# Patient Record
Sex: Female | Born: 1945 | Race: White | Hispanic: No | Smoking: Never smoker
Health system: Southern US, Community
[De-identification: ages and names within clinical notes are randomized; demographics above are authoritative.]

## PROBLEM LIST (undated history)

## (undated) DIAGNOSIS — E785 Hyperlipidemia, unspecified: Secondary | ICD-10-CM

## (undated) DIAGNOSIS — E119 Type 2 diabetes mellitus without complications: Secondary | ICD-10-CM

## (undated) DIAGNOSIS — I10 Essential (primary) hypertension: Secondary | ICD-10-CM

## (undated) HISTORY — PX: BREAST EXCISIONAL BIOPSY: SUR124

## (undated) HISTORY — DX: Hyperlipidemia, unspecified: E78.5

## (undated) HISTORY — DX: Essential (primary) hypertension: I10

## (undated) HISTORY — DX: Type 2 diabetes mellitus without complications: E11.9

## (undated) HISTORY — PX: AORTIC VALVE REPLACEMENT: SHX41

---

## 2016-09-22 ENCOUNTER — Encounter (HOSPITAL_COMMUNITY)
Admission: RE | Admit: 2016-09-22 | Discharge: 2016-09-22 | Disposition: A | Discharge status: Home / Self Care | Payer: Medicare Other | Source: Ambulatory Visit | Attending: Cardiology | Admitting: Cardiology

## 2016-09-22 VITALS — BP 132/68 | HR 84 | Ht 61.0 in | Wt 219.0 lb

## 2016-09-22 DIAGNOSIS — Z954 Presence of other heart-valve replacement: Secondary | ICD-10-CM | POA: Diagnosis not present

## 2016-09-22 NOTE — Progress Notes (Signed)
Cardiac Individual Treatment Plan  Patient Details  Name: Jessica Cuevas MRN: [id number] Date of Birth: 02-04-1946 Referring Provider:     CARDIAC REHAB PHASE II ORIENTATION from 09/22/2016 in [facility] CARDIAC REHABILITATION  Referring Provider  Dr. Lady Gary      Initial Encounter Date:    CARDIAC REHAB PHASE II ORIENTATION from 09/22/2016 in [place] CARDIAC REHABILITATION  Date  09/22/16  Referring Provider  Dr. Lady Gary      Visit Diagnosis: Status post aortic valve replacement with composite valve  Patient's Home Medications on Admission:  Current Outpatient Prescriptions:  .  carvedilol (COREG) 6.25 MG tablet, Take by mouth., Disp: , Rfl:  .  lisinopril (PRINIVIL,ZESTRIL) 20 MG tablet, Take by mouth., Disp: , Rfl:  .  Multiple Vitamin (MULTIVITAMIN WITH MINERALS) TABS tablet, Take 1 tablet by mouth daily., Disp: , Rfl:  .  aspirin 81 MG chewable tablet, Chew by mouth., Disp: , Rfl:  .  Cholecalciferol (VITAMIN D3) 5000 units TABS, Take 5,000 Units by mouth daily., Disp: , Rfl:  .  Coenzyme Q-10 200 MG CAPS, Take by mouth., Disp: , Rfl:  .  glimepiride (AMARYL) 4 MG tablet, Take by mouth., Disp: , Rfl:  .  insulin detemir (LEVEMIR) 100 UNIT/ML injection, Inject 40 Units into the skin at bedtime., Disp: , Rfl:  .  Magnesium Oxide, Antacid, 500 MG CAPS, Take by mouth., Disp: , Rfl:  .  Multiple Vitamins-Minerals (PRESERVISION AREDS 2+MULTI VIT PO), Take 1 capsule by mouth 2 (two) times daily., Disp: , Rfl:   Past Medical History: No past medical history on file.  Tobacco Use: History  Smoking Status  . Not on file  Smokeless Tobacco  . Not on file    Labs: Recent Review Flowsheet Data    There is no flowsheet data to display.      Capillary Blood Glucose: No results found for: GLUCAP   Exercise Target Goals: Date: 09/22/16  Exercise Program Goal: Individual exercise prescription set with THRR, safety & activity barriers. Participant demonstrates ability to  understand and report RPE using BORG scale, to self-measure pulse accurately, and to acknowledge the importance of the exercise prescription.  Exercise Prescription Goal: Starting with aerobic activity 30 plus minutes a day, 3 days per week for initial exercise prescription. Provide home exercise prescription and guidelines that participant acknowledges understanding prior to discharge.  Activity Barriers & Risk Stratification:   6 Minute Walk:     6 Minute Walk    Row Name 09/22/16 1401         6 Minute Walk   Phase Initial     Distance 1200 feet     Distance % Change 0 %     Walk Time 6 minutes     # of Rest Breaks 0     MPH 2.27     METS 2.74     RPE 13     Perceived Dyspnea  13     VO2 Peak 7.47     Symptoms No     Resting HR 84 bpm     Resting BP 132/68     Max Ex. HR 111 bpm     Max Ex. BP 148/84     2 Minute Post BP 130/74        Oxygen Initial Assessment:   Oxygen Re-Evaluation:   Oxygen Discharge (Final Oxygen Re-Evaluation):   Initial Exercise Prescription:     Initial Exercise Prescription - 09/22/16 1400      Date  of Initial Exercise RX and Referring Provider   Date 09/22/16   Referring Provider Dr. Lady Gary     Treadmill   MPH 1.3   Grade 0   Minutes 15   METs 1.9     NuStep   Level 2   SPM 24   Minutes 20   METs 2     Prescription Details   Frequency (times per week) 3   Duration Progress to 30 minutes of continuous aerobic without signs/symptoms of physical distress     Intensity   THRR 40-80% of Max Heartrate [phone]   Ratings of Perceived Exertion 11-13   Perceived Dyspnea 0-4     Progression   Progression Continue progressive overload as per policy without signs/symptoms or physical distress.     Resistance Training   Training Prescription Yes   Weight 1   Reps 10-15      Perform Capillary Blood Glucose checks as needed.  Exercise Prescription Changes:   Exercise Comments:   Exercise Goals and Review:       Exercise Goals    Row Name 09/22/16 1429             Exercise Goals   Increase Physical Activity Yes       Intervention Provide advice, education, support and counseling about physical activity/exercise needs.;Develop an individualized exercise prescription for aerobic and resistive training based on initial evaluation findings, risk stratification, comorbidities and participant's personal goals.       Expected Outcomes Achievement of increased cardiorespiratory fitness and enhanced flexibility, muscular endurance and strength shown through measurements of functional capacity and personal statement of participant.       Increase Strength and Stamina Yes       Intervention Provide advice, education, support and counseling about physical activity/exercise needs.;Develop an individualized exercise prescription for aerobic and resistive training based on initial evaluation findings, risk stratification, comorbidities and participant's personal goals.       Expected Outcomes Achievement of increased cardiorespiratory fitness and enhanced flexibility, muscular endurance and strength shown through measurements of functional capacity and personal statement of participant.          Exercise Goals Re-Evaluation :    Discharge Exercise Prescription (Final Exercise Prescription Changes):   Nutrition:  Target Goals: Understanding of nutrition guidelines, daily intake of sodium 1500mg , cholesterol 200mg , calories 30% from fat and 7% or less from saturated fats, daily to have 5 or more servings of fruits and vegetables.  Biometrics:     Pre Biometrics - 09/22/16 1406      Pre Biometrics   Height 5\' 1"  (1.549 m)   Waist Circumference 44 inches   Hip Circumference 48 inches   Waist to Hip Ratio 0.92 %   Triceps Skinfold 24 mm   % Body Fat 49.7 %   Grip Strength 39.06 kg   Flexibility 0 in  Herniated Disc Lumbar Spine.    Single Leg Stand 2 seconds       Nutrition Therapy Plan and  Nutrition Goals:   Nutrition Discharge: Rate Your Plate Scores:     Nutrition Assessments - 09/22/16 1509      MEDFICTS Scores   Pre Score 18      Nutrition Goals Re-Evaluation:   Nutrition Goals Discharge (Final Nutrition Goals Re-Evaluation):   Psychosocial: Target Goals: Acknowledge presence or absence of significant depression and/or stress, maximize coping skills, provide positive support system. Participant is able to verbalize types and ability to use techniques and skills needed for reducing  stress and depression.  Initial Review & Psychosocial Screening:     Initial Psych Review & Screening - 09/22/16 1503      Initial Review   Current issues with None Identified     Family Dynamics   Good Support System? Yes   Concerns Recent loss of significant other  Patient lose her 8 month grandson in December 2017.   Comments Patient has had a recent loss of a grand child. She has recently moved from [place] and lost her job. She says it has been hard but she is not depressed. Her QOL score was 25.41 and her PHQ-9 2.      Barriers   Psychosocial barriers to participate in program Psychosocial barriers identified (see note)     Screening Interventions   Interventions Encouraged to exercise      Quality of Life Scores:     Quality of Life - 09/22/16 1407      Quality of Life Scores   Health/Function Pre 22.96 %   Socioeconomic Pre 25.08 %   Psych/Spiritual Pre 28 %   Family Pre 30 %   GLOBAL Pre 25.41 %      PHQ-9: Recent Review Flowsheet Data    Depression screen [facility] 2/9 09/22/2016   Decreased Interest 0   Down, Depressed, Hopeless 0   PHQ - 2 Score 0   Altered sleeping 0   Tired, decreased energy 1   Change in appetite 1   Feeling bad or failure about yourself  0   Trouble concentrating 0   Moving slowly or fidgety/restless 0   Suicidal thoughts 0   PHQ-9 Score 2   Difficult doing work/chores Not difficult at all     Interpretation of Total  Score  Total Score Depression Severity:  1-4 = Minimal depression, 5-9 = Mild depression, 10-14 = Moderate depression, 15-19 = Moderately severe depression, 20-27 = Severe depression   Psychosocial Evaluation and Intervention:     Psychosocial Evaluation - 09/22/16 1506      Psychosocial Evaluation & Interventions   Interventions Relaxation education;Encouraged to exercise with the program and follow exercise prescription;Stress management education   Comments Patient has had a recent loss of a grand child. She has recently moved from [place] and lost her job. She says it has been hard but she is not depressed. Her QOL score was 25.41 and her PHQ-9 2.   Expected Outcomes Patient will have no psychsocial barriers identified at discharge.    Continue Psychosocial Services  No Follow up required      Psychosocial Re-Evaluation:   Psychosocial Discharge (Final Psychosocial Re-Evaluation):   Vocational Rehabilitation: Provide vocational rehab assistance to qualifying candidates.   Vocational Rehab Evaluation & Intervention:     Vocational Rehab - 09/22/16 1427      Initial Vocational Rehab Evaluation & Intervention   Assessment shows need for Vocational Rehabilitation No      Education: Education Goals: Education classes will be provided on a weekly basis, covering required topics. Participant will state understanding/return demonstration of topics presented.  Learning Barriers/Preferences:     Learning Barriers/Preferences - 09/22/16 1427      Learning Barriers/Preferences   Learning Barriers None   Learning Preferences Skilled Demonstration;Video      Education Topics: Hypertension, Hypertension Reduction -Define heart disease and high blood pressure. Discus how high blood pressure affects the body and ways to reduce high blood pressure.   Exercise and Your Heart -Discuss why it is important to exercise,  the FITT principles of exercise, normal and abnormal  responses to exercise, and how to exercise safely.   Angina -Discuss definition of angina, causes of angina, treatment of angina, and how to decrease risk of having angina.   Cardiac Medications -Review what the following cardiac medications are used for, how they affect the body, and side effects that may occur when taking the medications.  Medications include Aspirin, Beta blockers, calcium channel blockers, ACE Inhibitors, angiotensin receptor blockers, diuretics, digoxin, and antihyperlipidemics.   Congestive Heart Failure -Discuss the definition of CHF, how to live with CHF, the signs and symptoms of CHF, and how keep track of weight and sodium intake.   Heart Disease and Intimacy -Discus the effect sexual activity has on the heart, how changes occur during intimacy as we age, and safety during sexual activity.   Smoking Cessation / COPD -Discuss different methods to quit smoking, the health benefits of quitting smoking, and the definition of COPD.   Nutrition I: Fats -Discuss the types of cholesterol, what cholesterol does to the heart, and how cholesterol levels can be controlled.   Nutrition II: Labels -Discuss the different components of food labels and how to read food label   Heart Parts and Heart Disease -Discuss the anatomy of the heart, the pathway of blood circulation through the heart, and these are affected by heart disease.   Stress I: Signs and Symptoms -Discuss the causes of stress, how stress may lead to anxiety and depression, and ways to limit stress.   Stress II: Relaxation -Discuss different types of relaxation techniques to limit stress.   Warning Signs of Stroke / TIA -Discuss definition of a stroke, what the signs and symptoms are of a stroke, and how to identify when someone is having stroke.   Knowledge Questionnaire Score:     Knowledge Questionnaire Score - 09/22/16 1426      Knowledge Questionnaire Score   Pre Score 21/24       Core Components/Risk Factors/Patient Goals at Admission:     Personal Goals and Risk Factors at Admission - 09/22/16 1458      Core Components/Risk Factors/Patient Goals on Admission    Weight Management Obesity;Yes   Intervention Weight Management: Develop a combined nutrition and exercise program designed to reach desired caloric intake, while maintaining appropriate intake of nutrient and fiber, sodium and fats, and appropriate energy expenditure required for the weight goal.;Weight Management: Provide education and appropriate resources to help participant work on and attain dietary goals.;Weight Management/Obesity: Establish reasonable short term and long term weight goals.;Obesity: Provide education and appropriate resources to help participant work on and attain dietary goals.   Admit Weight 219 lb (99.3 kg)   Goal Weight: Short Term 214 lb (97.1 kg)   Goal Weight: Long Term 209 lb (94.8 kg)   Expected Outcomes Short Term: Continue to assess and modify interventions until short term weight is achieved;Long Term: Adherence to nutrition and physical activity/exercise program aimed toward attainment of established weight goal   Improve shortness of breath with ADL's Yes   Intervention Provide education, individualized exercise plan and daily activity instruction to help decrease symptoms of SOB with activities of daily living.   Expected Outcomes Short Term: Achieves a reduction of symptoms when performing activities of daily living.   Diabetes Yes   Intervention Provide education about signs/symptoms and action to take for hypo/hyperglycemia.;Provide education about proper nutrition, including hydration, and aerobic/resistive exercise prescription along with prescribed medications to achieve blood glucose in normal ranges:  Fasting glucose 65-99 mg/dL   Expected Outcomes Long Term: Attainment of HbA1C < 7%.   Personal Goal Other Yes   Personal Goal Increase strenght and stamina. Do  things without SOB.    Intervention Patient will attend CR 3 days/week and supplement with exercise 2 days/week at home.    Expected Outcomes Patient will meet her personal goals.       Core Components/Risk Factors/Patient Goals Review:    Core Components/Risk Factors/Patient Goals at Discharge (Final Review):    ITP Comments:   Comments: Patient arrived for 1st visit/orientation/education at 1230. Patient was referred to CR by Harold HedgeKenneth Fath due to S/P Aortic Valve Replacement (Z95.4). During orientation advised patient on arrival and appointment times what to wear, what to do before, during and after exercise. Reviewed attendance and class policy. Talked about inclement weather and class consultation policy. Pt is scheduled to return Cardiac Rehab on 09/25/16 at 11:00. Pt was advised to come to class 15 minutes before class starts. Patient was also given instructions on meeting with the dietician and attending the Family Structure classes. Pt is eager to get started. Patient participated in warm up stretches followed by light weights and resistance bands. Patient was able to complete 6 minute walk test. Patient was measured for the equipment. Discussed equipment safety with patient. Took patient pre-anthropometric measurements. Patient finished visit at 1430.

## 2016-09-22 NOTE — Progress Notes (Signed)
Daily Session Note  Patient Details  Name: Jessica Cuevas MRN: [id number] Date of Birth: 11-02-1945 Referring Provider:     CARDIAC REHAB PHASE II ORIENTATION from 09/22/2016 in [place]  Referring Provider  Dr. Ubaldo Glassing      Encounter Date: 09/22/2016  Check In:     Session Check In - 09/22/16 1230      Check-In   Location [facility]   Staff Present Suzanne Boron, BS, EP, Exercise Physiologist;Zenith Lamphier Wynetta Emery, RN, BSN   Supervising physician immediately available to respond to emergencies See telemetry face sheet for immediately available MD   Medication changes reported     No   Fall or balance concerns reported    No   Tobacco Cessation --  Patient has never smoked or used smokeless tobacco.   Warm-up and Cool-down Performed as group-led instruction   Resistance Training Performed Yes   VAD Patient? No     Pain Assessment   Currently in Pain? No/denies   Multiple Pain Sites No      Capillary Blood Glucose: No results found for this or any previous visit (from the past 24 hour(s)).    History  Smoking Status  . Not on file  Smokeless Tobacco  . Not on file    Goals Met:  Proper associated with RPD/PD & O2 Sat Exercise tolerated well Personal goals reviewed No report of cardiac concerns or symptoms Strength training completed today  Goals Unmet:  Not Applicable  Comments: Check out 1430. Patient's orientation visit.    Dr. Kate Sable is Medical Director for [facility] Cardiac and Pulmonary Rehab.

## 2016-09-22 NOTE — Progress Notes (Signed)
Cardiac/Pulmonary Rehab Medication Review by a Pharmacist  Does the patient  feel that his/her medications are working for him/her?  yes  Has the patient been experiencing any side effects to the medications prescribed?  no  Does the patient measure his/her own blood pressure or blood glucose at home?  yes   Does the patient have any problems obtaining medications due to transportation or finances?   yes  Understanding of regimen: good Understanding of indications: good Potential of compliance: excellent  Questions asked to Determine Patient Understanding of Medication Regimen:  1. What is the name of the medication?  2. What is the medication used for?  3. When should it be taken?  4. How much should be taken?  5. How will you take it?  6. What side effects should you report?  Understanding Defined as: Excellent: All questions above are correct Good: Questions 1-4 are correct Fair: Questions 1-2 are correct  Poor: 1 or none of the above questions are correct   Pharmacist comments: Pt does c/o an itchy rash that comes and goes but cannot determine if it's caused by any specific medication.  Has been taking current medications for quite a while.  Pt does state that the Levemir is very expensive and has difficulty obtaining it at times.  Pt is close to being in "donut hole".  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  Pt states she does check her blood sugar and will start checking blood pressure.  Med list updated.    Jessica Cuevas, [name] A 09/22/2016 2:24 PM

## 2016-09-25 ENCOUNTER — Encounter (HOSPITAL_COMMUNITY)
Admission: RE | Admit: 2016-09-25 | Discharge: 2016-09-25 | Disposition: A | Discharge status: Home / Self Care | Payer: Medicare Other | Source: Ambulatory Visit | Attending: Cardiology | Admitting: Cardiology

## 2016-09-25 DIAGNOSIS — Z954 Presence of other heart-valve replacement: Secondary | ICD-10-CM

## 2016-09-25 NOTE — Progress Notes (Signed)
Cardiac Individual Treatment Plan  Patient Details  Name: Jessica Cuevas MRN: [id number] Date of Birth: [birth year] Referring Provider:     CARDIAC REHAB PHASE II ORIENTATION from 09/22/2016 in [facility] CARDIAC REHABILITATION  Referring Provider  Dr. Lady Gary      Initial Encounter Date:    CARDIAC REHAB PHASE II ORIENTATION from 09/22/2016 in [place] CARDIAC REHABILITATION  Date  09/22/16  Referring Provider  Dr. Lady Gary      Visit Diagnosis: Status post aortic valve replacement with composite valve  Patient's Home Medications on Admission:  Current Outpatient Prescriptions:  .  aspirin 81 MG chewable tablet, Chew by mouth., Disp: , Rfl:  .  carvedilol (COREG) 6.25 MG tablet, Take by mouth., Disp: , Rfl:  .  Cholecalciferol (VITAMIN D3) 5000 units TABS, Take 5,000 Units by mouth daily., Disp: , Rfl:  .  Coenzyme Q-10 200 MG CAPS, Take by mouth., Disp: , Rfl:  .  glimepiride (AMARYL) 4 MG tablet, Take by mouth., Disp: , Rfl:  .  insulin detemir (LEVEMIR) 100 UNIT/ML injection, Inject 40 Units into the skin at bedtime., Disp: , Rfl:  .  lisinopril (PRINIVIL,ZESTRIL) 20 MG tablet, Take by mouth., Disp: , Rfl:  .  Magnesium Oxide, Antacid, 500 MG CAPS, Take by mouth., Disp: , Rfl:  .  Multiple Vitamin (MULTIVITAMIN WITH MINERALS) TABS tablet, Take 1 tablet by mouth daily., Disp: , Rfl:  .  Multiple Vitamins-Minerals (PRESERVISION AREDS 2+MULTI VIT PO), Take 1 capsule by mouth 2 (two) times daily., Disp: , Rfl:   Past Medical History: No past medical history on file.  Tobacco Use: History  Smoking Status  . Not on file  Smokeless Tobacco  . Not on file    Labs: Recent Review Flowsheet Data    There is no flowsheet data to display.      Capillary Blood Glucose: No results found for: GLUCAP   Exercise Target Goals:    Exercise Program Goal: Individual exercise prescription set with THRR, safety & activity barriers. Participant demonstrates ability to understand and  report RPE using BORG scale, to self-measure pulse accurately, and to acknowledge the importance of the exercise prescription.  Exercise Prescription Goal: Starting with aerobic activity 30 plus minutes a day, 3 days per week for initial exercise prescription. Provide home exercise prescription and guidelines that participant acknowledges understanding prior to discharge.  Activity Barriers & Risk Stratification:   6 Minute Walk:     6 Minute Walk    Row Name 09/22/16 1401         6 Minute Walk   Phase Initial     Distance 1200 feet     Distance % Change 0 %     Walk Time 6 minutes     # of Rest Breaks 0     MPH 2.27     METS 2.74     RPE 13     Perceived Dyspnea  13     VO2 Peak 7.47     Symptoms No     Resting HR 84 bpm     Resting BP 132/68     Max Ex. HR 111 bpm     Max Ex. BP 148/84     2 Minute Post BP 130/74        Oxygen Initial Assessment:   Oxygen Re-Evaluation:   Oxygen Discharge (Final Oxygen Re-Evaluation):   Initial Exercise Prescription:     Initial Exercise Prescription - 09/22/16 1400      Date  of Initial Exercise RX and Referring Provider   Date 09/22/16   Referring Provider Dr. Lady GaryFath     Treadmill   MPH 1.3   Grade 0   Minutes 15   METs 1.9     NuStep   Level 2   SPM 24   Minutes 20   METs 2     Prescription Details   Frequency (times per week) 3   Duration Progress to 30 minutes of continuous aerobic without signs/symptoms of physical distress     Intensity   THRR 40-80% of Max Heartrate [phone]   Ratings of Perceived Exertion 11-13   Perceived Dyspnea 0-4     Progression   Progression Continue progressive overload as per policy without signs/symptoms or physical distress.     Resistance Training   Training Prescription Yes   Weight 1   Reps 10-15      Perform Capillary Blood Glucose checks as needed.  Exercise Prescription Changes:   Exercise Comments:   Exercise Goals and Review:      Exercise  Goals    Row Name 09/22/16 1429             Exercise Goals   Increase Physical Activity Yes       Intervention Provide advice, education, support and counseling about physical activity/exercise needs.;Develop an individualized exercise prescription for aerobic and resistive training based on initial evaluation findings, risk stratification, comorbidities and participant's personal goals.       Expected Outcomes Achievement of increased cardiorespiratory fitness and enhanced flexibility, muscular endurance and strength shown through measurements of functional capacity and personal statement of participant.       Increase Strength and Stamina Yes       Intervention Provide advice, education, support and counseling about physical activity/exercise needs.;Develop an individualized exercise prescription for aerobic and resistive training based on initial evaluation findings, risk stratification, comorbidities and participant's personal goals.       Expected Outcomes Achievement of increased cardiorespiratory fitness and enhanced flexibility, muscular endurance and strength shown through measurements of functional capacity and personal statement of participant.          Exercise Goals Re-Evaluation :    Discharge Exercise Prescription (Final Exercise Prescription Changes):   Nutrition:  Target Goals: Understanding of nutrition guidelines, daily intake of sodium 1500mg , cholesterol 200mg , calories 30% from fat and 7% or less from saturated fats, daily to have 5 or more servings of fruits and vegetables.  Biometrics:     Pre Biometrics - 09/22/16 1406      Pre Biometrics   Height 5\' 1"  (1.549 m)   Waist Circumference 44 inches   Hip Circumference 48 inches   Waist to Hip Ratio 0.92 %   Triceps Skinfold 24 mm   % Body Fat 49.7 %   Grip Strength 39.06 kg   Flexibility 0 in  Herniated Disc Lumbar Spine.    Single Leg Stand 2 seconds       Nutrition Therapy Plan and Nutrition  Goals:   Nutrition Discharge: Rate Your Plate Scores:     Nutrition Assessments - 09/22/16 1509      MEDFICTS Scores   Pre Score 18      Nutrition Goals Re-Evaluation:   Nutrition Goals Discharge (Final Nutrition Goals Re-Evaluation):   Psychosocial: Target Goals: Acknowledge presence or absence of significant depression and/or stress, maximize coping skills, provide positive support system. Participant is able to verbalize types and ability to use techniques and skills needed for reducing  stress and depression.  Initial Review & Psychosocial Screening:     Initial Psych Review & Screening - 09/22/16 1503      Initial Review   Current issues with None Identified     Family Dynamics   Good Support System? Yes   Concerns Recent loss of significant other  Patient lose her 8 month grandson in December 2017.   Comments Patient has had a recent loss of a grand child. She has recently moved from [place] and lost her job. She says it has been hard but she is not depressed. Her QOL score was 25.41 and her PHQ-9 2.      Barriers   Psychosocial barriers to participate in program Psychosocial barriers identified (see note)     Screening Interventions   Interventions Encouraged to exercise      Quality of Life Scores:     Quality of Life - 09/22/16 1407      Quality of Life Scores   Health/Function Pre 22.96 %   Socioeconomic Pre 25.08 %   Psych/Spiritual Pre 28 %   Family Pre 30 %   GLOBAL Pre 25.41 %      PHQ-9: Recent Review Flowsheet Data    Depression screen [facility] 2/9 09/22/2016   Decreased Interest 0   Down, Depressed, Hopeless 0   PHQ - 2 Score 0   Altered sleeping 0   Tired, decreased energy 1   Change in appetite 1   Feeling bad or failure about yourself  0   Trouble concentrating 0   Moving slowly or fidgety/restless 0   Suicidal thoughts 0   PHQ-9 Score 2   Difficult doing work/chores Not difficult at all     Interpretation of Total Score   Total Score Depression Severity:  1-4 = Minimal depression, 5-9 = Mild depression, 10-14 = Moderate depression, 15-19 = Moderately severe depression, 20-27 = Severe depression   Psychosocial Evaluation and Intervention:     Psychosocial Evaluation - 09/22/16 1506      Psychosocial Evaluation & Interventions   Interventions Relaxation education;Encouraged to exercise with the program and follow exercise prescription;Stress management education   Comments Patient has had a recent loss of a grand child. She has recently moved from [place] and lost her job. She says it has been hard but she is not depressed. Her QOL score was 25.41 and her PHQ-9 2.   Expected Outcomes Patient will have no psychsocial barriers identified at discharge.    Continue Psychosocial Services  No Follow up required      Psychosocial Re-Evaluation:   Psychosocial Discharge (Final Psychosocial Re-Evaluation):   Vocational Rehabilitation: Provide vocational rehab assistance to qualifying candidates.   Vocational Rehab Evaluation & Intervention:     Vocational Rehab - 09/22/16 1427      Initial Vocational Rehab Evaluation & Intervention   Assessment shows need for Vocational Rehabilitation No      Education: Education Goals: Education classes will be provided on a weekly basis, covering required topics. Participant will state understanding/return demonstration of topics presented.  Learning Barriers/Preferences:     Learning Barriers/Preferences - 09/22/16 1427      Learning Barriers/Preferences   Learning Barriers None   Learning Preferences Skilled Demonstration;Video      Education Topics: Hypertension, Hypertension Reduction -Define heart disease and high blood pressure. Discus how high blood pressure affects the body and ways to reduce high blood pressure.   Exercise and Your Heart -Discuss why it is important to exercise,  the FITT principles of exercise, normal and abnormal responses  to exercise, and how to exercise safely.   Angina -Discuss definition of angina, causes of angina, treatment of angina, and how to decrease risk of having angina.   Cardiac Medications -Review what the following cardiac medications are used for, how they affect the body, and side effects that may occur when taking the medications.  Medications include Aspirin, Beta blockers, calcium channel blockers, ACE Inhibitors, angiotensin receptor blockers, diuretics, digoxin, and antihyperlipidemics.   Congestive Heart Failure -Discuss the definition of CHF, how to live with CHF, the signs and symptoms of CHF, and how keep track of weight and sodium intake.   Heart Disease and Intimacy -Discus the effect sexual activity has on the heart, how changes occur during intimacy as we age, and safety during sexual activity.   Smoking Cessation / COPD -Discuss different methods to quit smoking, the health benefits of quitting smoking, and the definition of COPD.   Nutrition I: Fats -Discuss the types of cholesterol, what cholesterol does to the heart, and how cholesterol levels can be controlled.   Nutrition II: Labels -Discuss the different components of food labels and how to read food label   Heart Parts and Heart Disease -Discuss the anatomy of the heart, the pathway of blood circulation through the heart, and these are affected by heart disease.   Stress I: Signs and Symptoms -Discuss the causes of stress, how stress may lead to anxiety and depression, and ways to limit stress.   Stress II: Relaxation -Discuss different types of relaxation techniques to limit stress.   Warning Signs of Stroke / TIA -Discuss definition of a stroke, what the signs and symptoms are of a stroke, and how to identify when someone is having stroke.   Knowledge Questionnaire Score:     Knowledge Questionnaire Score - 09/22/16 1426      Knowledge Questionnaire Score   Pre Score 21/24      Core  Components/Risk Factors/Patient Goals at Admission:     Personal Goals and Risk Factors at Admission - 09/22/16 1458      Core Components/Risk Factors/Patient Goals on Admission    Weight Management Obesity;Yes   Intervention Weight Management: Develop a combined nutrition and exercise program designed to reach desired caloric intake, while maintaining appropriate intake of nutrient and fiber, sodium and fats, and appropriate energy expenditure required for the weight goal.;Weight Management: Provide education and appropriate resources to help participant work on and attain dietary goals.;Weight Management/Obesity: Establish reasonable short term and long term weight goals.;Obesity: Provide education and appropriate resources to help participant work on and attain dietary goals.   Admit Weight 219 lb (99.3 kg)   Goal Weight: Short Term 214 lb (97.1 kg)   Goal Weight: Long Term 209 lb (94.8 kg)   Expected Outcomes Short Term: Continue to assess and modify interventions until short term weight is achieved;Long Term: Adherence to nutrition and physical activity/exercise program aimed toward attainment of established weight goal   Improve shortness of breath with ADL's Yes   Intervention Provide education, individualized exercise plan and daily activity instruction to help decrease symptoms of SOB with activities of daily living.   Expected Outcomes Short Term: Achieves a reduction of symptoms when performing activities of daily living.   Diabetes Yes   Intervention Provide education about signs/symptoms and action to take for hypo/hyperglycemia.;Provide education about proper nutrition, including hydration, and aerobic/resistive exercise prescription along with prescribed medications to achieve blood glucose in normal ranges:  Fasting glucose 65-99 mg/dL   Expected Outcomes Long Term: Attainment of HbA1C < 7%.   Personal Goal Other Yes   Personal Goal Increase strenght and stamina. Do things without  SOB.    Intervention Patient will attend CR 3 days/week and supplement with exercise 2 days/week at home.    Expected Outcomes Patient will meet her personal goals.       Core Components/Risk Factors/Patient Goals Review:    Core Components/Risk Factors/Patient Goals at Discharge (Final Review):    ITP Comments:     ITP Comments    Row Name 09/25/16 1246           ITP Comments Patient new to program. Started today 09/25/16. Will continue to monitor for progress.           Comments: ITP 30 Day REVIEW Patient new to program. Started today 09/25/16. Will continue to monitor for progress.

## 2016-09-25 NOTE — Progress Notes (Signed)
Daily Session Note  Patient Details  Name: Jessica Cuevas MRN: [id number] Date of Birth: [birth year] Referring Provider:     CARDIAC REHAB PHASE II ORIENTATION from 09/22/2016 in [facility]  Referring Provider  Dr. Ubaldo Glassing      Encounter Date: 09/25/2016  Check In:     Session Check In - 09/25/16 1119      Check-In   Location [facility]   Staff Present Aundra Dubin, RN, BSN;Eda Magnussen Luther Parody, BS, EP, Exercise Physiologist   Supervising physician immediately available to respond to emergencies See telemetry face sheet for immediately available MD   Medication changes reported     No   Fall or balance concerns reported    No   Warm-up and Cool-down Performed as group-led instruction   Resistance Training Performed Yes   VAD Patient? No     Pain Assessment   Currently in Pain? No/denies   Pain Score 0-No pain   Multiple Pain Sites No      Capillary Blood Glucose: No results found for this or any previous visit (from the past 24 hour(s)).    History  Smoking Status  . Not on file  Smokeless Tobacco  . Not on file    Goals Met:  Independence with exercise equipment Exercise tolerated well No report of cardiac concerns or symptoms Strength training completed today  Goals Unmet:  Not Applicable  Comments: Check out 1200   Dr. Bronson Ing

## 2016-09-27 ENCOUNTER — Encounter (HOSPITAL_COMMUNITY)
Admission: RE | Admit: 2016-09-27 | Discharge: 2016-09-27 | Disposition: A | Discharge status: Home / Self Care | Payer: Medicare Other | Source: Ambulatory Visit | Attending: Cardiology | Admitting: Cardiology

## 2016-09-27 DIAGNOSIS — Z954 Presence of other heart-valve replacement: Secondary | ICD-10-CM | POA: Diagnosis not present

## 2016-09-27 NOTE — Progress Notes (Signed)
Daily Session Note  Patient Details  Name: Jessica Cuevas MRN: [id number] Date of Birth: 03/27/1946 Referring Provider:     CARDIAC REHAB PHASE II ORIENTATION from 09/22/2016 in [facility] CARDIAC REHABILITATION  Referring Provider  Dr. Fath      Encounter Date: 09/27/2016  Check In:     Session Check In - 09/27/16 1105      Check-In   Location [facility]   Staff Present Debra Johnson, RN, BSN;Diane Coad, MS, EP, CHC, Exercise Physiologist;Tifanny Dollens, BS, EP, Exercise Physiologist   Supervising physician immediately available to respond to emergencies See telemetry face sheet for immediately available MD   Medication changes reported     No   Fall or balance concerns reported    No   Warm-up and Cool-down Performed as group-led instruction   Resistance Training Performed Yes   VAD Patient? No     Pain Assessment   Currently in Pain? Yes   Pain Score 3    Pain Location Back   Pain Orientation Left   Pain Descriptors / Indicators Constant   Pain Type Acute pain   Pain Onset Yesterday   Pain Frequency Occasional   Multiple Pain Sites No      Capillary Blood Glucose: No results found for this or any previous visit (from the past 24 hour(s)).    History  Smoking Status  . Not on file  Smokeless Tobacco  . Not on file    Goals Met:  Independence with exercise equipment Exercise tolerated well No report of cardiac concerns or symptoms Strength training completed today  Goals Unmet:  Not Applicable  Comments: Check out 1200. Pain 3/10 lower back and left glute.    Dr. Suresh Koneswaran is Medical Director for [facility]. 

## 2016-09-29 ENCOUNTER — Encounter (HOSPITAL_COMMUNITY)
Admission: RE | Admit: 2016-09-29 | Discharge: 2016-09-29 | Disposition: A | Discharge status: Home / Self Care | Payer: Medicare Other | Source: Ambulatory Visit | Attending: Cardiology | Admitting: Cardiology

## 2016-09-29 DIAGNOSIS — Z954 Presence of other heart-valve replacement: Secondary | ICD-10-CM | POA: Diagnosis not present

## 2016-09-29 NOTE — Progress Notes (Signed)
Daily Session Note  Patient Details  Name: Taylour Lietzke MRN: [id number] Date of Birth: [birth year] Referring Provider:     CARDIAC REHAB PHASE II ORIENTATION from 09/22/2016 in [facility]  Referring Provider  Dr. Ubaldo Glassing      Encounter Date: 09/29/2016  Check In:     Session Check In - 09/29/16 1105      Check-In   Location [facility]   Staff Present Aundra Dubin, RN, BSN;Kaelin Bonelli Luther Parody, BS, EP, Exercise Physiologist   Supervising physician immediately available to respond to emergencies See telemetry face sheet for immediately available MD   Medication changes reported     No   Fall or balance concerns reported    No   Warm-up and Cool-down Performed as group-led instruction   Resistance Training Performed Yes   VAD Patient? No     Pain Assessment   Currently in Pain? No/denies   Pain Score 0-No pain   Multiple Pain Sites No      Capillary Blood Glucose: No results found for this or any previous visit (from the past 24 hour(s)).    History  Smoking Status  . Not on file  Smokeless Tobacco  . Not on file    Goals Met:  Independence with exercise equipment Exercise tolerated well No report of cardiac concerns or symptoms Strength training completed today  Goals Unmet:  Not Applicable  Comments: Check out 1200   Dr. Kate Sable is Medical Director for [facility].

## 2016-10-02 ENCOUNTER — Encounter (HOSPITAL_COMMUNITY): Payer: Medicare Other

## 2016-10-04 ENCOUNTER — Encounter (HOSPITAL_COMMUNITY): Payer: Medicare Other

## 2016-10-06 ENCOUNTER — Encounter (HOSPITAL_COMMUNITY): Payer: Medicare Other

## 2016-10-09 ENCOUNTER — Encounter (HOSPITAL_COMMUNITY): Payer: Medicare Other

## 2016-10-11 ENCOUNTER — Encounter (HOSPITAL_COMMUNITY): Payer: Medicare Other

## 2016-10-13 ENCOUNTER — Encounter (HOSPITAL_COMMUNITY): Payer: Medicare Other

## 2016-10-16 ENCOUNTER — Encounter (HOSPITAL_COMMUNITY): Payer: Medicare Other

## 2016-10-18 ENCOUNTER — Encounter (HOSPITAL_COMMUNITY): Payer: Medicare Other

## 2016-10-19 ENCOUNTER — Encounter (HOSPITAL_COMMUNITY): Payer: Medicare Other

## 2016-10-19 NOTE — Progress Notes (Signed)
Cardiac Individual Treatment Plan  Patient Details  Name: Jessica Cuevas MRN: [id number] Date of Birth: 20-Apr-1945 Referring Provider:     CARDIAC REHAB PHASE II ORIENTATION from 09/22/2016 in [facility] CARDIAC REHABILITATION  Referring Provider  Dr. Lady Gary      Initial Encounter Date:    CARDIAC REHAB PHASE II ORIENTATION from 09/22/2016 in [place] CARDIAC REHABILITATION  Date  09/22/16  Referring Provider  Dr. Lady Gary      Visit Diagnosis: Status post aortic valve replacement with composite valve  Patient's Home Medications on Admission:  Current Outpatient Prescriptions:  .  aspirin 81 MG chewable tablet, Chew by mouth., Disp: , Rfl:  .  carvedilol (COREG) 6.25 MG tablet, Take by mouth., Disp: , Rfl:  .  Cholecalciferol (VITAMIN D3) 5000 units TABS, Take 5,000 Units by mouth daily., Disp: , Rfl:  .  Coenzyme Q-10 200 MG CAPS, Take by mouth., Disp: , Rfl:  .  glimepiride (AMARYL) 4 MG tablet, Take by mouth., Disp: , Rfl:  .  insulin detemir (LEVEMIR) 100 UNIT/ML injection, Inject 40 Units into the skin at bedtime., Disp: , Rfl:  .  lisinopril (PRINIVIL,ZESTRIL) 20 MG tablet, Take by mouth., Disp: , Rfl:  .  Magnesium Oxide, Antacid, 500 MG CAPS, Take by mouth., Disp: , Rfl:  .  Multiple Vitamin (MULTIVITAMIN WITH MINERALS) TABS tablet, Take 1 tablet by mouth daily., Disp: , Rfl:  .  Multiple Vitamins-Minerals (PRESERVISION AREDS 2+MULTI VIT PO), Take 1 capsule by mouth 2 (two) times daily., Disp: , Rfl:   Past Medical History: No past medical history on file.  Tobacco Use: History  Smoking Status  . Not on file  Smokeless Tobacco  . Not on file    Labs: Recent Review Flowsheet Data    There is no flowsheet data to display.      Capillary Blood Glucose: No results found for: GLUCAP   Exercise Target Goals:    Exercise Program Goal: Individual exercise prescription set with THRR, safety & activity barriers. Participant demonstrates ability to understand and  report RPE using BORG scale, to self-measure pulse accurately, and to acknowledge the importance of the exercise prescription.  Exercise Prescription Goal: Starting with aerobic activity 30 plus minutes a day, 3 days per week for initial exercise prescription. Provide home exercise prescription and guidelines that participant acknowledges understanding prior to discharge.  Activity Barriers & Risk Stratification:   6 Minute Walk:     6 Minute Walk    Row Name 09/22/16 1401         6 Minute Walk   Phase Initial     Distance 1200 feet     Distance % Change 0 %     Walk Time 6 minutes     # of Rest Breaks 0     MPH 2.27     METS 2.74     RPE 13     Perceived Dyspnea  13     VO2 Peak 7.47     Symptoms No     Resting HR 84 bpm     Resting BP 132/68     Max Ex. HR 111 bpm     Max Ex. BP 148/84     2 Minute Post BP 130/74        Oxygen Initial Assessment:   Oxygen Re-Evaluation:   Oxygen Discharge (Final Oxygen Re-Evaluation):   Initial Exercise Prescription:     Initial Exercise Prescription - 09/22/16 1400      Date  of Initial Exercise RX and Referring Provider   Date 09/22/16   Referring Provider Dr. Lady GaryFath     Treadmill   MPH 1.3   Grade 0   Minutes 15   METs 1.9     NuStep   Level 2   SPM 24   Minutes 20   METs 2     Prescription Details   Frequency (times per week) 3   Duration Progress to 30 minutes of continuous aerobic without signs/symptoms of physical distress     Intensity   THRR 40-80% of Max Heartrate [phone]   Ratings of Perceived Exertion 11-13   Perceived Dyspnea 0-4     Progression   Progression Continue progressive overload as per policy without signs/symptoms or physical distress.     Resistance Training   Training Prescription Yes   Weight 1   Reps 10-15      Perform Capillary Blood Glucose checks as needed.  Exercise Prescription Changes:   Exercise Comments:   Exercise Goals and Review:      Exercise  Goals    Row Name 09/22/16 1429             Exercise Goals   Increase Physical Activity Yes       Intervention Provide advice, education, support and counseling about physical activity/exercise needs.;Develop an individualized exercise prescription for aerobic and resistive training based on initial evaluation findings, risk stratification, comorbidities and participant's personal goals.       Expected Outcomes Achievement of increased cardiorespiratory fitness and enhanced flexibility, muscular endurance and strength shown through measurements of functional capacity and personal statement of participant.       Increase Strength and Stamina Yes       Intervention Provide advice, education, support and counseling about physical activity/exercise needs.;Develop an individualized exercise prescription for aerobic and resistive training based on initial evaluation findings, risk stratification, comorbidities and participant's personal goals.       Expected Outcomes Achievement of increased cardiorespiratory fitness and enhanced flexibility, muscular endurance and strength shown through measurements of functional capacity and personal statement of participant.          Exercise Goals Re-Evaluation :    Discharge Exercise Prescription (Final Exercise Prescription Changes):   Nutrition:  Target Goals: Understanding of nutrition guidelines, daily intake of sodium 1500mg , cholesterol 200mg , calories 30% from fat and 7% or less from saturated fats, daily to have 5 or more servings of fruits and vegetables.  Biometrics:     Pre Biometrics - 09/22/16 1406      Pre Biometrics   Height 5\' 1"  (1.549 m)   Waist Circumference 44 inches   Hip Circumference 48 inches   Waist to Hip Ratio 0.92 %   Triceps Skinfold 24 mm   % Body Fat 49.7 %   Grip Strength 39.06 kg   Flexibility 0 in  Herniated Disc Lumbar Spine.    Single Leg Stand 2 seconds       Nutrition Therapy Plan and Nutrition  Goals:   Nutrition Discharge: Rate Your Plate Scores:     Nutrition Assessments - 09/22/16 1509      MEDFICTS Scores   Pre Score 18      Nutrition Goals Re-Evaluation:   Nutrition Goals Discharge (Final Nutrition Goals Re-Evaluation):   Psychosocial: Target Goals: Acknowledge presence or absence of significant depression and/or stress, maximize coping skills, provide positive support system. Participant is able to verbalize types and ability to use techniques and skills needed for reducing  stress and depression.  Initial Review & Psychosocial Screening:     Initial Psych Review & Screening - 09/22/16 1503      Initial Review   Current issues with None Identified     Family Dynamics   Good Support System? Yes   Concerns Recent loss of significant other  Patient lose her 8 month grandson in December 2017.   Comments Patient has had a recent loss of a grand child. She has recently moved from [place] and lost her job. She says it has been hard but she is not depressed. Her QOL score was 25.41 and her PHQ-9 2.      Barriers   Psychosocial barriers to participate in program Psychosocial barriers identified (see note)     Screening Interventions   Interventions Encouraged to exercise      Quality of Life Scores:     Quality of Life - 09/22/16 1407      Quality of Life Scores   Health/Function Pre 22.96 %   Socioeconomic Pre 25.08 %   Psych/Spiritual Pre 28 %   Family Pre 30 %   GLOBAL Pre 25.41 %      PHQ-9: Recent Review Flowsheet Data    Depression screen [facility] 2/9 09/22/2016   Decreased Interest 0   Down, Depressed, Hopeless 0   PHQ - 2 Score 0   Altered sleeping 0   Tired, decreased energy 1   Change in appetite 1   Feeling bad or failure about yourself  0   Trouble concentrating 0   Moving slowly or fidgety/restless 0   Suicidal thoughts 0   PHQ-9 Score 2   Difficult doing work/chores Not difficult at all     Interpretation of Total Score   Total Score Depression Severity:  1-4 = Minimal depression, 5-9 = Mild depression, 10-14 = Moderate depression, 15-19 = Moderately severe depression, 20-27 = Severe depression   Psychosocial Evaluation and Intervention:     Psychosocial Evaluation - 09/22/16 1506      Psychosocial Evaluation & Interventions   Interventions Relaxation education;Encouraged to exercise with the program and follow exercise prescription;Stress management education   Comments Patient has had a recent loss of a grand child. She has recently moved from [place] and lost her job. She says it has been hard but she is not depressed. Her QOL score was 25.41 and her PHQ-9 2.   Expected Outcomes Patient will have no psychsocial barriers identified at discharge.    Continue Psychosocial Services  No Follow up required      Psychosocial Re-Evaluation:   Psychosocial Discharge (Final Psychosocial Re-Evaluation):   Vocational Rehabilitation: Provide vocational rehab assistance to qualifying candidates.   Vocational Rehab Evaluation & Intervention:     Vocational Rehab - 09/22/16 1427      Initial Vocational Rehab Evaluation & Intervention   Assessment shows need for Vocational Rehabilitation No      Education: Education Goals: Education classes will be provided on a weekly basis, covering required topics. Participant will state understanding/return demonstration of topics presented.  Learning Barriers/Preferences:     Learning Barriers/Preferences - 09/22/16 1427      Learning Barriers/Preferences   Learning Barriers None   Learning Preferences Skilled Demonstration;Video      Education Topics: Hypertension, Hypertension Reduction -Define heart disease and high blood pressure. Discus how high blood pressure affects the body and ways to reduce high blood pressure.   Exercise and Your Heart -Discuss why it is important to exercise,  the FITT principles of exercise, normal and abnormal responses  to exercise, and how to exercise safely.   Angina -Discuss definition of angina, causes of angina, treatment of angina, and how to decrease risk of having angina.   Cardiac Medications -Review what the following cardiac medications are used for, how they affect the body, and side effects that may occur when taking the medications.  Medications include Aspirin, Beta blockers, calcium channel blockers, ACE Inhibitors, angiotensin receptor blockers, diuretics, digoxin, and antihyperlipidemics.   Congestive Heart Failure -Discuss the definition of CHF, how to live with CHF, the signs and symptoms of CHF, and how keep track of weight and sodium intake.   Heart Disease and Intimacy -Discus the effect sexual activity has on the heart, how changes occur during intimacy as we age, and safety during sexual activity.   Smoking Cessation / COPD -Discuss different methods to quit smoking, the health benefits of quitting smoking, and the definition of COPD.   Nutrition I: Fats -Discuss the types of cholesterol, what cholesterol does to the heart, and how cholesterol levels can be controlled.   CARDIAC REHAB PHASE II EXERCISE from 09/27/2016 in [facility] CARDIAC REHABILITATION  Date  09/27/16  Educator  DC  Instruction Review Code  2- meets goals/outcomes      Nutrition II: Labels -Discuss the different components of food labels and how to read food label   Heart Parts and Heart Disease -Discuss the anatomy of the heart, the pathway of blood circulation through the heart, and these are affected by heart disease.   Stress I: Signs and Symptoms -Discuss the causes of stress, how stress may lead to anxiety and depression, and ways to limit stress.   Stress II: Relaxation -Discuss different types of relaxation techniques to limit stress.   Warning Signs of Stroke / TIA -Discuss definition of a stroke, what the signs and symptoms are of a stroke, and how to identify when someone is  having stroke.   Knowledge Questionnaire Score:     Knowledge Questionnaire Score - 09/22/16 1426      Knowledge Questionnaire Score   Pre Score 21/24      Core Components/Risk Factors/Patient Goals at Admission:     Personal Goals and Risk Factors at Admission - 09/22/16 1458      Core Components/Risk Factors/Patient Goals on Admission    Weight Management Obesity;Yes   Intervention Weight Management: Develop a combined nutrition and exercise program designed to reach desired caloric intake, while maintaining appropriate intake of nutrient and fiber, sodium and fats, and appropriate energy expenditure required for the weight goal.;Weight Management: Provide education and appropriate resources to help participant work on and attain dietary goals.;Weight Management/Obesity: Establish reasonable short term and long term weight goals.;Obesity: Provide education and appropriate resources to help participant work on and attain dietary goals.   Admit Weight 219 lb (99.3 kg)   Goal Weight: Short Term 214 lb (97.1 kg)   Goal Weight: Long Term 209 lb (94.8 kg)   Expected Outcomes Short Term: Continue to assess and modify interventions until short term weight is achieved;Long Term: Adherence to nutrition and physical activity/exercise program aimed toward attainment of established weight goal   Improve shortness of breath with ADL's Yes   Intervention Provide education, individualized exercise plan and daily activity instruction to help decrease symptoms of SOB with activities of daily living.   Expected Outcomes Short Term: Achieves a reduction of symptoms when performing activities of daily living.   Diabetes Yes  Intervention Provide education about signs/symptoms and action to take for hypo/hyperglycemia.;Provide education about proper nutrition, including hydration, and aerobic/resistive exercise prescription along with prescribed medications to achieve blood glucose in normal ranges:  Fasting glucose 65-99 mg/dL   Expected Outcomes Long Term: Attainment of HbA1C < 7%.   Personal Goal Other Yes   Personal Goal Increase strenght and stamina. Do things without SOB.    Intervention Patient will attend CR 3 days/week and supplement with exercise 2 days/week at home.    Expected Outcomes Patient will meet her personal goals.       Core Components/Risk Factors/Patient Goals Review:    Core Components/Risk Factors/Patient Goals at Discharge (Final Review):    ITP Comments:     ITP Comments    Row Name 09/25/16 1246 10/19/16 0737         ITP Comments Patient new to program. Started today 09/25/16. Will continue to monitor for progress.  Patient has completed 4 sessions. She has stopped the program to do physical therapy to help treat low back pain secondary to a pinched nerve. She plans to return to complete the program if the therapy relieves the pain. Will continue to monitor.          Comments: ITP 30 Day REVIEW Patient has completed 4 sessions. She has stopped the program to do physical therapy to help treat low back pain secondary to a compressed nerve. She plans to return to complete the program if the therapy relieves the pain. Will continue to monitor.

## 2016-10-20 ENCOUNTER — Encounter (HOSPITAL_COMMUNITY): Payer: Medicare Other

## 2016-10-23 ENCOUNTER — Encounter (HOSPITAL_COMMUNITY): Payer: Medicare Other

## 2016-10-25 ENCOUNTER — Encounter (HOSPITAL_COMMUNITY): Payer: Medicare Other

## 2016-10-27 ENCOUNTER — Encounter (HOSPITAL_COMMUNITY): Payer: Medicare Other

## 2016-10-30 ENCOUNTER — Encounter (HOSPITAL_COMMUNITY): Payer: Medicare Other

## 2016-11-01 ENCOUNTER — Encounter (HOSPITAL_COMMUNITY): Payer: Medicare Other

## 2016-11-03 ENCOUNTER — Other Ambulatory Visit: Payer: Self-pay | Admitting: Family Medicine

## 2016-11-03 ENCOUNTER — Encounter (HOSPITAL_COMMUNITY): Payer: Medicare Other

## 2016-11-03 DIAGNOSIS — Z1231 Encounter for screening mammogram for malignant neoplasm of breast: Secondary | ICD-10-CM

## 2016-11-06 ENCOUNTER — Encounter (HOSPITAL_COMMUNITY): Payer: Medicare Other

## 2016-11-07 NOTE — Progress Notes (Signed)
Cardiac Individual Treatment Plan  Patient Details  Name: Jessica Cuevas MRN: [id number] Date of Birth: Sep 14, 1945 Referring Provider:     CARDIAC REHAB PHASE II ORIENTATION from 09/22/2016 in [facility] CARDIAC REHABILITATION  Referring Provider  Dr. Lady Gary      Initial Encounter Date:    CARDIAC REHAB PHASE II ORIENTATION from 09/22/2016 in [place] CARDIAC REHABILITATION  Date  09/22/16  Referring Provider  Dr. Lady Gary      Visit Diagnosis: Status post aortic valve replacement with composite valve  Patient's Home Medications on Admission:  Current Outpatient Prescriptions:  .  aspirin 81 MG chewable tablet, Chew by mouth., Disp: , Rfl:  .  carvedilol (COREG) 6.25 MG tablet, Take by mouth., Disp: , Rfl:  .  Cholecalciferol (VITAMIN D3) 5000 units TABS, Take 5,000 Units by mouth daily., Disp: , Rfl:  .  Coenzyme Q-10 200 MG CAPS, Take by mouth., Disp: , Rfl:  .  glimepiride (AMARYL) 4 MG tablet, Take by mouth., Disp: , Rfl:  .  insulin detemir (LEVEMIR) 100 UNIT/ML injection, Inject 40 Units into the skin at bedtime., Disp: , Rfl:  .  lisinopril (PRINIVIL,ZESTRIL) 20 MG tablet, Take by mouth., Disp: , Rfl:  .  Magnesium Oxide, Antacid, 500 MG CAPS, Take by mouth., Disp: , Rfl:  .  Multiple Vitamin (MULTIVITAMIN WITH MINERALS) TABS tablet, Take 1 tablet by mouth daily., Disp: , Rfl:  .  Multiple Vitamins-Minerals (PRESERVISION AREDS 2+MULTI VIT PO), Take 1 capsule by mouth 2 (two) times daily., Disp: , Rfl:   Past Medical History: No past medical history on file.  Tobacco Use: History  Smoking Status  . Not on file  Smokeless Tobacco  . Not on file    Labs: Recent Review Flowsheet Data    There is no flowsheet data to display.      Capillary Blood Glucose: No results found for: GLUCAP   Exercise Target Goals:    Exercise Program Goal: Individual exercise prescription set with THRR, safety & activity barriers. Participant demonstrates ability to understand and  report RPE using BORG scale, to self-measure pulse accurately, and to acknowledge the importance of the exercise prescription.  Exercise Prescription Goal: Starting with aerobic activity 30 plus minutes a day, 3 days per week for initial exercise prescription. Provide home exercise prescription and guidelines that participant acknowledges understanding prior to discharge.  Activity Barriers & Risk Stratification:   6 Minute Walk:     6 Minute Walk    Row Name 09/22/16 1401         6 Minute Walk   Phase Initial     Distance 1200 feet     Distance % Change 0 %     Walk Time 6 minutes     # of Rest Breaks 0     MPH 2.27     METS 2.74     RPE 13     Perceived Dyspnea  13     VO2 Peak 7.47     Symptoms No     Resting HR 84 bpm     Resting BP 132/68     Max Ex. HR 111 bpm     Max Ex. BP 148/84     2 Minute Post BP 130/74        Oxygen Initial Assessment:   Oxygen Re-Evaluation:   Oxygen Discharge (Final Oxygen Re-Evaluation):   Initial Exercise Prescription:     Initial Exercise Prescription - 09/22/16 1400      Date  of Initial Exercise RX and Referring Provider   Date 09/22/16   Referring Provider Dr. Lady GaryFath     Treadmill   MPH 1.3   Grade 0   Minutes 15   METs 1.9     NuStep   Level 2   SPM 24   Minutes 20   METs 2     Prescription Details   Frequency (times per week) 3   Duration Progress to 30 minutes of continuous aerobic without signs/symptoms of physical distress     Intensity   THRR 40-80% of Max Heartrate [phone]   Ratings of Perceived Exertion 11-13   Perceived Dyspnea 0-4     Progression   Progression Continue progressive overload as per policy without signs/symptoms or physical distress.     Resistance Training   Training Prescription Yes   Weight 1   Reps 10-15      Perform Capillary Blood Glucose checks as needed.  Exercise Prescription Changes:   Exercise Comments:   Exercise Goals and Review:      Exercise  Goals    Row Name 09/22/16 1429             Exercise Goals   Increase Physical Activity Yes       Intervention Provide advice, education, support and counseling about physical activity/exercise needs.;Develop an individualized exercise prescription for aerobic and resistive training based on initial evaluation findings, risk stratification, comorbidities and participant's personal goals.       Expected Outcomes Achievement of increased cardiorespiratory fitness and enhanced flexibility, muscular endurance and strength shown through measurements of functional capacity and personal statement of participant.       Increase Strength and Stamina Yes       Intervention Provide advice, education, support and counseling about physical activity/exercise needs.;Develop an individualized exercise prescription for aerobic and resistive training based on initial evaluation findings, risk stratification, comorbidities and participant's personal goals.       Expected Outcomes Achievement of increased cardiorespiratory fitness and enhanced flexibility, muscular endurance and strength shown through measurements of functional capacity and personal statement of participant.          Exercise Goals Re-Evaluation :    Discharge Exercise Prescription (Final Exercise Prescription Changes):   Nutrition:  Target Goals: Understanding of nutrition guidelines, daily intake of sodium 1500mg , cholesterol 200mg , calories 30% from fat and 7% or less from saturated fats, daily to have 5 or more servings of fruits and vegetables.  Biometrics:     Pre Biometrics - 09/22/16 1406      Pre Biometrics   Height 5\' 1"  (1.549 m)   Waist Circumference 44 inches   Hip Circumference 48 inches   Waist to Hip Ratio 0.92 %   Triceps Skinfold 24 mm   % Body Fat 49.7 %   Grip Strength 39.06 kg   Flexibility 0 in  Herniated Disc Lumbar Spine.    Single Leg Stand 2 seconds       Nutrition Therapy Plan and Nutrition  Goals:   Nutrition Discharge: Rate Your Plate Scores:     Nutrition Assessments - 09/22/16 1509      MEDFICTS Scores   Pre Score 18      Nutrition Goals Re-Evaluation:   Nutrition Goals Discharge (Final Nutrition Goals Re-Evaluation):   Psychosocial: Target Goals: Acknowledge presence or absence of significant depression and/or stress, maximize coping skills, provide positive support system. Participant is able to verbalize types and ability to use techniques and skills needed for reducing  stress and depression.  Initial Review & Psychosocial Screening:     Initial Psych Review & Screening - 09/22/16 1503      Initial Review   Current issues with None Identified     Family Dynamics   Good Support System? Yes   Concerns Recent loss of significant other  Patient lose her 8 month grandson in December 2017.   Comments Patient has had a recent loss of a grand child. She has recently moved from [place] and lost her job. She says it has been hard but she is not depressed. Her QOL score was 25.41 and her PHQ-9 2.      Barriers   Psychosocial barriers to participate in program Psychosocial barriers identified (see note)     Screening Interventions   Interventions Encouraged to exercise      Quality of Life Scores:     Quality of Life - 09/22/16 1407      Quality of Life Scores   Health/Function Pre 22.96 %   Socioeconomic Pre 25.08 %   Psych/Spiritual Pre 28 %   Family Pre 30 %   GLOBAL Pre 25.41 %      PHQ-9: Recent Review Flowsheet Data    Depression screen [facility] 2/9 09/22/2016   Decreased Interest 0   Down, Depressed, Hopeless 0   PHQ - 2 Score 0   Altered sleeping 0   Tired, decreased energy 1   Change in appetite 1   Feeling bad or failure about yourself  0   Trouble concentrating 0   Moving slowly or fidgety/restless 0   Suicidal thoughts 0   PHQ-9 Score 2   Difficult doing work/chores Not difficult at all     Interpretation of Total Score   Total Score Depression Severity:  1-4 = Minimal depression, 5-9 = Mild depression, 10-14 = Moderate depression, 15-19 = Moderately severe depression, 20-27 = Severe depression   Psychosocial Evaluation and Intervention:     Psychosocial Evaluation - 09/22/16 1506      Psychosocial Evaluation & Interventions   Interventions Relaxation education;Encouraged to exercise with the program and follow exercise prescription;Stress management education   Comments Patient has had a recent loss of a grand child. She has recently moved from [place] and lost her job. She says it has been hard but she is not depressed. Her QOL score was 25.41 and her PHQ-9 2.   Expected Outcomes Patient will have no psychsocial barriers identified at discharge.    Continue Psychosocial Services  No Follow up required      Psychosocial Re-Evaluation:   Psychosocial Discharge (Final Psychosocial Re-Evaluation):   Vocational Rehabilitation: Provide vocational rehab assistance to qualifying candidates.   Vocational Rehab Evaluation & Intervention:     Vocational Rehab - 09/22/16 1427      Initial Vocational Rehab Evaluation & Intervention   Assessment shows need for Vocational Rehabilitation No      Education: Education Goals: Education classes will be provided on a weekly basis, covering required topics. Participant will state understanding/return demonstration of topics presented.  Learning Barriers/Preferences:     Learning Barriers/Preferences - 09/22/16 1427      Learning Barriers/Preferences   Learning Barriers None   Learning Preferences Skilled Demonstration;Video      Education Topics: Hypertension, Hypertension Reduction -Define heart disease and high blood pressure. Discus how high blood pressure affects the body and ways to reduce high blood pressure.   Exercise and Your Heart -Discuss why it is important to exercise,  the FITT principles of exercise, normal and abnormal responses  to exercise, and how to exercise safely.   Angina -Discuss definition of angina, causes of angina, treatment of angina, and how to decrease risk of having angina.   Cardiac Medications -Review what the following cardiac medications are used for, how they affect the body, and side effects that may occur when taking the medications.  Medications include Aspirin, Beta blockers, calcium channel blockers, ACE Inhibitors, angiotensin receptor blockers, diuretics, digoxin, and antihyperlipidemics.   Congestive Heart Failure -Discuss the definition of CHF, how to live with CHF, the signs and symptoms of CHF, and how keep track of weight and sodium intake.   Heart Disease and Intimacy -Discus the effect sexual activity has on the heart, how changes occur during intimacy as we age, and safety during sexual activity.   Smoking Cessation / COPD -Discuss different methods to quit smoking, the health benefits of quitting smoking, and the definition of COPD.   Nutrition I: Fats -Discuss the types of cholesterol, what cholesterol does to the heart, and how cholesterol levels can be controlled.   CARDIAC REHAB PHASE II EXERCISE from 09/27/2016 in [facility] CARDIAC REHABILITATION  Date  09/27/16  Educator  DC  Instruction Review Code  2- meets goals/outcomes      Nutrition II: Labels -Discuss the different components of food labels and how to read food label   Heart Parts and Heart Disease -Discuss the anatomy of the heart, the pathway of blood circulation through the heart, and these are affected by heart disease.   Stress I: Signs and Symptoms -Discuss the causes of stress, how stress may lead to anxiety and depression, and ways to limit stress.   Stress II: Relaxation -Discuss different types of relaxation techniques to limit stress.   Warning Signs of Stroke / TIA -Discuss definition of a stroke, what the signs and symptoms are of a stroke, and how to identify when someone is  having stroke.   Knowledge Questionnaire Score:     Knowledge Questionnaire Score - 09/22/16 1426      Knowledge Questionnaire Score   Pre Score 21/24      Core Components/Risk Factors/Patient Goals at Admission:     Personal Goals and Risk Factors at Admission - 09/22/16 1458      Core Components/Risk Factors/Patient Goals on Admission    Weight Management Obesity;Yes   Intervention Weight Management: Develop a combined nutrition and exercise program designed to reach desired caloric intake, while maintaining appropriate intake of nutrient and fiber, sodium and fats, and appropriate energy expenditure required for the weight goal.;Weight Management: Provide education and appropriate resources to help participant work on and attain dietary goals.;Weight Management/Obesity: Establish reasonable short term and long term weight goals.;Obesity: Provide education and appropriate resources to help participant work on and attain dietary goals.   Admit Weight 219 lb (99.3 kg)   Goal Weight: Short Term 214 lb (97.1 kg)   Goal Weight: Long Term 209 lb (94.8 kg)   Expected Outcomes Short Term: Continue to assess and modify interventions until short term weight is achieved;Long Term: Adherence to nutrition and physical activity/exercise program aimed toward attainment of established weight goal   Improve shortness of breath with ADL's Yes   Intervention Provide education, individualized exercise plan and daily activity instruction to help decrease symptoms of SOB with activities of daily living.   Expected Outcomes Short Term: Achieves a reduction of symptoms when performing activities of daily living.   Diabetes Yes  Intervention Provide education about signs/symptoms and action to take for hypo/hyperglycemia.;Provide education about proper nutrition, including hydration, and aerobic/resistive exercise prescription along with prescribed medications to achieve blood glucose in normal ranges:  Fasting glucose 65-99 mg/dL   Expected Outcomes Long Term: Attainment of HbA1C < 7%.   Personal Goal Other Yes   Personal Goal Increase strenght and stamina. Do things without SOB.    Intervention Patient will attend CR 3 days/week and supplement with exercise 2 days/week at home.    Expected Outcomes Patient will meet her personal goals.       Core Components/Risk Factors/Patient Goals Review:    Core Components/Risk Factors/Patient Goals at Discharge (Final Review):    ITP Comments:     ITP Comments    Row Name 09/25/16 1246 10/19/16 0737 11/07/16 1509       ITP Comments Patient new to program. Started today 09/25/16. Will continue to monitor for progress.  Patient has completed 4 sessions. She has stopped the program to do physical therapy to help treat low back pain secondary to a pinched nerve. She plans to return to complete the program if the therapy relieves the pain. Will continue to monitor.  Patient stopped coming after 4 sessions d/t chronic back pain. MD notified.         Comments: Patient stopped coming to Cardiac Rehab on 09/29/2016 after completing 4 sessions d/t chronic back pain. Doctor will be informed.

## 2016-11-07 NOTE — Addendum Note (Signed)
Encounter addended by: Suann LarryJohnson, Natalio Salois L, RN on: 11/07/2016  3:13 PM<BR>    Actions taken: Visit Navigator Flowsheet section accepted, Sign clinical note, Episode resolved

## 2016-11-07 NOTE — Progress Notes (Signed)
Discharge Summary  Patient Details  Name: Jessica Cuevas MRN: [id number] Date of Birth: 1945-05-04 Referring Provider:     CARDIAC REHAB PHASE II ORIENTATION [facility] 09[facility]22/[facility]  Referring Provider  Dr. Lady GaryFath       Number of Visits: 4  Reason for Discharge:  Early Exit:  Chronic back pain.  Smoking History:  History  Smoking Status  . Not on file  Smokeless Tobacco  . Not on file    Diagnosis:  Status post aortic valve replacement with composite valve  ADL UCSD:   Initial Exercise Prescription:     Initial Exercise Prescription - 09/22/16 1400      Date of Initial Exercise RX and Referring Provider   Date 09/22/16   Referring Provider Dr. Lady GaryFath     Treadmill   MPH 1.3   Grade 0   Minutes 15   METs 1.9     NuStep   Level 2   SPM 24   Minutes 20   METs 2     Prescription Details   Frequency (times per week) 3   Duration Progress to 30 minutes of continuous aerobic without signs/symptoms of physical distress     Intensity   THRR 40-80% of Max Heartrate [phone]   Ratings of Perceived Exertion 11-13   Perceived Dyspnea 0-4     Progression   Progression Continue progressive overload as per policy without signs/symptoms or physical distress.     Resistance Training   Training Prescription Yes   Weight 1   Reps 10-15      Discharge Exercise Prescription (Final Exercise Prescription Changes):   Functional Capacity:     6 Minute Walk    Row Name 09/22/16 1401         6 Minute Walk   Phase Initial     Distance 1200 feet     Distance % Change 0 %     Walk Time 6 minutes     # of Rest Breaks 0     MPH 2.27     METS 2.74     RPE 13     Perceived Dyspnea  13     VO2 Peak 7.47     Symptoms No     Resting HR 84 bpm     Resting BP 132/68     Max Ex. HR 111 bpm     Max Ex. BP 148/84     2 Minute Post BP 130/74        Psychological, QOL, Others - Outcomes: PHQ 2/9: Depression screen PHQ 2/9 09/22/2016   Decreased Interest 0  Down, Depressed, Hopeless 0  PHQ - 2 Score 0  Altered sleeping 0  Tired, decreased energy 1  Change in appetite 1  Feeling bad or failure about yourself  0  Trouble concentrating 0  Moving slowly or fidgety/restless 0  Suicidal thoughts 0  PHQ-9 Score 2  Difficult doing work/chores Not difficult at all    Quality of Life:     Quality of Life - 09/22/16 1407      Quality of Life Scores   Health/Function Pre 22.96 %   Socioeconomic Pre 25.08 %   Psych/Spiritual Pre 28 %   Family Pre 30 %   GLOBAL Pre 25.41 %      Personal Goals: Goals established at orientation with interventions provided to work toward goal.     Personal Goals and Risk Factors at Admission - 09/22/16 1458  Core Components/Risk Factors/Patient Goals on Admission    Weight Management Obesity;Yes   Intervention Weight Management: Develop a combined nutrition and exercise program designed to reach desired caloric intake, while maintaining appropriate intake of nutrient and fiber, sodium and fats, and appropriate energy expenditure required for the weight goal.;Weight Management: Provide education and appropriate resources to help participant work on and attain dietary goals.;Weight Management/Obesity: Establish reasonable short term and long term weight goals.;Obesity: Provide education and appropriate resources to help participant work on and attain dietary goals.   Admit Weight 219 lb (99.3 kg)   Goal Weight: Short Term 214 lb (97.1 kg)   Goal Weight: Long Term 209 lb (94.8 kg)   Expected Outcomes Short Term: Continue to assess and modify interventions until short term weight is achieved;Long Term: Adherence to nutrition and physical activity/exercise program aimed toward attainment of established weight goal   Improve shortness of breath with ADL's Yes   Intervention Provide education, individualized exercise plan and daily activity instruction to help decrease symptoms of SOB with  activities of daily living.   Expected Outcomes Short Term: Achieves a reduction of symptoms when performing activities of daily living.   Diabetes Yes   Intervention Provide education about signs/symptoms and action to take for hypo/hyperglycemia.;Provide education about proper nutrition, including hydration, and aerobic/resistive exercise prescription along with prescribed medications to achieve blood glucose in normal ranges: Fasting glucose 65-99 mg/dL   Expected Outcomes Long Term: Attainment of HbA1C < 7%.   Personal Goal Other Yes   Personal Goal Increase strenght and stamina. Do things without SOB.    Intervention Patient will attend CR 3 days/week and supplement with exercise 2 days/week at home.    Expected Outcomes Patient will meet her personal goals.        Personal Goals Discharge:   Nutrition & Weight - Outcomes:     Pre Biometrics - 09/22/16 1406      Pre Biometrics   Height 5\' 1"  (1.549 m)   Waist Circumference 44 inches   Hip Circumference 48 inches   Waist to Hip Ratio 0.92 %   Triceps Skinfold 24 mm   % Body Fat 49.7 %   Grip Strength 39.06 kg   Flexibility 0 in  Herniated Disc Lumbar Spine.    Single Leg Stand 2 seconds       Nutrition:   Nutrition Discharge:     Nutrition Assessments - 09/22/16 1509      MEDFICTS Scores   Pre Score 18      Education Questionnaire Score:     Knowledge Questionnaire Score - 09/22/16 1426      Knowledge Questionnaire Score   Pre Score 21/24

## 2016-11-08 ENCOUNTER — Encounter (HOSPITAL_COMMUNITY): Payer: Medicare Other

## 2016-11-10 ENCOUNTER — Encounter (HOSPITAL_COMMUNITY): Payer: Medicare Other

## 2016-11-13 ENCOUNTER — Encounter (HOSPITAL_COMMUNITY): Payer: Medicare Other

## 2016-11-15 ENCOUNTER — Encounter (HOSPITAL_COMMUNITY): Payer: Medicare Other

## 2016-11-17 ENCOUNTER — Encounter (HOSPITAL_COMMUNITY): Payer: Medicare Other

## 2016-11-20 ENCOUNTER — Encounter (HOSPITAL_COMMUNITY): Payer: Medicare Other

## 2016-11-22 ENCOUNTER — Ambulatory Visit
Admission: RE | Admit: 2016-11-22 | Discharge: 2016-11-22 | Disposition: A | Discharge status: Home / Self Care | Payer: Medicare Other | Source: Ambulatory Visit | Attending: Family Medicine | Admitting: Family Medicine

## 2016-11-22 ENCOUNTER — Encounter (HOSPITAL_COMMUNITY): Payer: Medicare Other

## 2016-11-22 DIAGNOSIS — Z1231 Encounter for screening mammogram for malignant neoplasm of breast: Secondary | ICD-10-CM | POA: Insufficient documentation

## 2016-11-24 ENCOUNTER — Encounter (HOSPITAL_COMMUNITY): Payer: Medicare Other

## 2016-11-27 ENCOUNTER — Encounter (HOSPITAL_COMMUNITY): Payer: Medicare Other

## 2016-11-28 ENCOUNTER — Inpatient Hospital Stay
Admission: RE | Admit: 2016-11-28 | Discharge: 2016-11-28 | Disposition: A | Discharge status: Home / Self Care | Payer: Self-pay | Source: Ambulatory Visit | Attending: *Deleted | Admitting: *Deleted

## 2016-11-28 ENCOUNTER — Other Ambulatory Visit: Payer: Self-pay | Admitting: *Deleted

## 2016-11-28 DIAGNOSIS — Z9289 Personal history of other medical treatment: Secondary | ICD-10-CM

## 2016-11-29 ENCOUNTER — Encounter (HOSPITAL_COMMUNITY): Payer: Medicare Other

## 2016-12-01 ENCOUNTER — Encounter (HOSPITAL_COMMUNITY): Payer: Medicare Other

## 2016-12-04 ENCOUNTER — Encounter (HOSPITAL_COMMUNITY): Payer: Medicare Other

## 2016-12-06 ENCOUNTER — Encounter (HOSPITAL_COMMUNITY): Payer: Medicare Other

## 2016-12-08 ENCOUNTER — Encounter (HOSPITAL_COMMUNITY): Payer: Medicare Other

## 2016-12-11 ENCOUNTER — Encounter (HOSPITAL_COMMUNITY): Payer: Medicare Other

## 2016-12-13 ENCOUNTER — Encounter (HOSPITAL_COMMUNITY): Payer: Medicare Other

## 2016-12-15 ENCOUNTER — Encounter (HOSPITAL_COMMUNITY): Payer: Medicare Other

## 2017-01-09 ENCOUNTER — Ambulatory Visit
Admission: RE | Admit: 2017-01-09 | Discharge: 2017-01-09 | Disposition: A | Discharge status: Home / Self Care | Payer: Medicare Other | Source: Ambulatory Visit | Attending: Family Medicine | Admitting: Family Medicine

## 2017-01-09 ENCOUNTER — Other Ambulatory Visit: Payer: Self-pay | Admitting: Family Medicine

## 2017-01-09 DIAGNOSIS — M79662 Pain in left lower leg: Secondary | ICD-10-CM

## 2017-01-19 ENCOUNTER — Encounter (INDEPENDENT_AMBULATORY_CARE_PROVIDER_SITE_OTHER): Payer: Self-pay | Admitting: Vascular Surgery

## 2017-01-19 ENCOUNTER — Ambulatory Visit (INDEPENDENT_AMBULATORY_CARE_PROVIDER_SITE_OTHER): Payer: Medicare Other | Admitting: Vascular Surgery

## 2017-01-19 VITALS — BP 157/88 | HR 91 | Resp 16 | Ht 61.0 in | Wt 220.0 lb

## 2017-01-19 DIAGNOSIS — I83813 Varicose veins of bilateral lower extremities with pain: Secondary | ICD-10-CM | POA: Insufficient documentation

## 2017-01-19 DIAGNOSIS — E119 Type 2 diabetes mellitus without complications: Secondary | ICD-10-CM | POA: Insufficient documentation

## 2017-01-19 DIAGNOSIS — M79605 Pain in left leg: Secondary | ICD-10-CM | POA: Diagnosis not present

## 2017-01-19 DIAGNOSIS — E118 Type 2 diabetes mellitus with unspecified complications: Secondary | ICD-10-CM | POA: Diagnosis not present

## 2017-01-19 DIAGNOSIS — M79604 Pain in right leg: Secondary | ICD-10-CM | POA: Diagnosis not present

## 2017-01-19 NOTE — Progress Notes (Signed)
Subjective:    Patient ID: Jessica Cuevas, female    DOB: December 03, 1945, 71 y.o.   MRN: [id number] Chief Complaint  Patient presents with  . New Patient (Initial Visit)    Varicose veins   Presents as a new patient referred by Dr. Lockie Pares for "painful varicosities". 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 She states that over the last 6 months they have become more painful. The patient reports the pain worsens towards the end of the day.Her pain also increases with sitting or standing for long periods of time. The varicosities to the left lower extremity are more painful then compared to the right. The patient does not experience any bilateral lower extremity edema. The patient does endorse a worsening history of pain with ambulation. This pain starts in her buttocks and radiates down the legs to the calf. Patient denies any rest pain or ulcerations to the lower extremity. Her discomfort has progressive the point she is unable to function on a daily basis and this is what prompted her to seek medical attention.Patient denies any trauma or surgeries to the bilateral lower extremity. Patient denies any history of DVT to the lower extremity. Patient denies any fever, nausea or vomiting.    Review of Systems  Constitutional: Negative.   HENT: Negative.   Eyes: Negative.   Respiratory: Negative.   Cardiovascular:       Bilateral painful varicose veins. Claudication with ambulation.  Gastrointestinal: Negative.   Endocrine: Negative.   Genitourinary: Negative.   Musculoskeletal: Negative.   Skin: Negative.   Allergic/Immunologic: Negative.   Neurological: Negative.   Hematological: Negative.   Psychiatric/Behavioral: Negative.       Objective:   Physical Exam  Constitutional: She is oriented to person, place, and time. She appears well-developed and well-nourished. No distress.  HENT:  Head: Normocephalic and atraumatic.  Eyes: Pupils  are equal, round, and reactive to light. Conjunctivae are normal.  Neck: Normal range of motion.  Cardiovascular: Normal rate, regular rhythm, normal heart sounds and intact distal pulses.   Pulses:      Radial pulses are 2+ on the right side, and 2+ on the left side.       Dorsalis pedis pulses are 1+ on the right side, and 1+ on the left side.       Posterior tibial pulses are 1+ on the right side, and 1+ on the left side.  Pulmonary/Chest: Effort normal.  Musculoskeletal: Normal range of motion. She exhibits no edema.  Neurological: She is alert and oriented to person, place, and time.  Skin: She is not diaphoretic.  Right lower extremity: greater than 1cm varicosities noted throughout the extremity. Left lower extremity: greater than 1cm and less than 1cm varicosities located throughout the extremity No stasis dermatitis. No cellulitis. Skin is intact.  Vitals reviewed.  BP (!) 157/88 (BP Location: Right Arm)   Pulse 91   Resp 16   Ht  (1.549 m)   Wt 220 lb (99.8 kg)   BMI 41.57 kg/m   Past Medical History:  Diagnosis Date  . Diabetes mellitus without complication (HCC)   . Hyperlipidemia   . Hypertension    Social History   Social History  . Marital status: Divorced    Spouse name: N/A  . Number of children: N/A  . Years of education: N/A   Occupational History  . Not on file.   Social History Main Topics  . Smoking status: Never Smoker  .  Smokeless tobacco: Never Used  . Alcohol use No  . Drug use: Unknown  . Sexual activity: Not on file   Other Topics Concern  . Not on file   Social History Narrative  . No narrative on file   Past Surgical History:  Procedure Laterality Date  . BREAST EXCISIONAL BIOPSY Left    benign excision   History reviewed. No pertinent family history.  Allergies  Allergen Reactions  . Aloe Rash  . Metformin And Related     Had lactic acidosis  . Penicillins Rash  . Tape Rash      Assessment & Plan:  Presents as  a new patient referred by Dr. Lockie Pares for "painful varicosities". 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 She states that over the last 6 months they have become more painful. The patient reports the pain worsens towards the end of the day.Her pain also increases with sitting or standing for long periods of time. The varicosities to the left lower extremity are more painful then compared to the right. The patient does not experience any bilateral lower extremity edema. The patient does endorse a worsening history of pain with ambulation. This pain starts in her buttocks and radiates down the legs to the calf. Patient denies any rest pain or ulcerations to the lower extremity. Her discomfort has progressive the point she is unable to function on a daily basis and this is what prompted her to seek medical attention.Patient denies any trauma or surgeries to the bilateral lower extremity. Patient denies any history of DVT to the lower extremity. Patient denies any fever, nausea or vomiting.   1. 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 His could be a major contributor to the patient's lower extremity discomfort. The patient does have risk factors for peripheral artery disease Hard to palpate pedal pulses We'll order an ABI to rule out any peripheral artery disease that may be contributing to the patient's discomfort  - VAS [place] ABI WITH/WO TBI; Future  2. Varicose veins of both lower extremities with pain - New The patient was encouraged to wear graduated compression stockings (20-30 mmHg) on a daily basis. The patient was instructed to begin wearing the stockings first thing in the morning and removing them in the evening. The patient was instructed specifically not to sleep in the stockings. Prescription given. In addition, behavioral modification including elevation during the day will  be initiated. The patient was advised to follow up in three months after wearing her compression stockings daily with elevation. The patient was instructed to call the office in the interim if any worsening edema or ulcerations to the legs, feet or toes occurs. The patient expresses their understanding.  - VAS [place] LOWER EXTREMITY VENOUS REFLUX; Future  3. Type 2 diabetes mellitus with complication, unspecified whether long term insulin use (HCC) - Stable Encouraged good control as its slows the progression of atherosclerotic disease  Current Outpatient Prescriptions on File Prior to Visit  Medication Sig Dispense Refill  . aspirin 81 MG chewable tablet Chew by mouth.    . carvedilol (COREG) 6.25 MG tablet Take by mouth.    . Cholecalciferol (VITAMIN D3) 5000 units TABS Take 5,000 Units by mouth daily.    . Coenzyme Q-10 200 MG CAPS Take by mouth.    Marland Kitchen glimepiride (AMARYL) 4 MG tablet Take by mouth.    Marland Kitchen lisinopril (PRINIVIL,ZESTRIL) 20 MG  tablet Take by mouth.    . Magnesium Oxide, Antacid, 500 MG CAPS Take by mouth.    . Multiple Vitamin (MULTIVITAMIN WITH MINERALS) TABS tablet Take 1 tablet by mouth daily.    . Multiple Vitamins-Minerals (PRESERVISION AREDS 2+MULTI VIT PO) Take 1 capsule by mouth 2 (two) times daily.     No current facility-administered medications on file prior to visit.    There are no Patient Instructions on file for this visit. No Follow-up on file.  Izic Stfort A Dequarius Jeffries, PA-C

## 2017-02-26 ENCOUNTER — Telehealth (INDEPENDENT_AMBULATORY_CARE_PROVIDER_SITE_OTHER): Payer: Self-pay

## 2017-02-26 NOTE — Telephone Encounter (Signed)
Patient called stating she was having some leg pain especially when she walks,She stated she has been taking  aleve for the pain.I spoke with KS and she advise for the pt to take over the counter meds for pain and also where her compression stockings and elevate.

## 2017-05-01 ENCOUNTER — Encounter (INDEPENDENT_AMBULATORY_CARE_PROVIDER_SITE_OTHER): Payer: Medicare Other

## 2017-05-01 ENCOUNTER — Ambulatory Visit (INDEPENDENT_AMBULATORY_CARE_PROVIDER_SITE_OTHER): Payer: Medicare Other | Admitting: Vascular Surgery

## 2017-09-18 ENCOUNTER — Ambulatory Visit (INDEPENDENT_AMBULATORY_CARE_PROVIDER_SITE_OTHER): Payer: Medicare Other | Admitting: Vascular Surgery

## 2017-09-18 ENCOUNTER — Encounter (INDEPENDENT_AMBULATORY_CARE_PROVIDER_SITE_OTHER): Payer: Medicare Other

## 2017-12-06 ENCOUNTER — Encounter (INDEPENDENT_AMBULATORY_CARE_PROVIDER_SITE_OTHER): Payer: Medicare Other

## 2017-12-06 ENCOUNTER — Ambulatory Visit (INDEPENDENT_AMBULATORY_CARE_PROVIDER_SITE_OTHER): Payer: Medicare Other | Admitting: Vascular Surgery

## 2018-03-13 ENCOUNTER — Other Ambulatory Visit: Payer: Self-pay | Admitting: Family Medicine

## 2018-03-13 DIAGNOSIS — Z1231 Encounter for screening mammogram for malignant neoplasm of breast: Secondary | ICD-10-CM

## 2018-04-19 ENCOUNTER — Ambulatory Visit
Admission: RE | Admit: 2018-04-19 | Discharge: 2018-04-19 | Disposition: A | Discharge status: Home / Self Care | Payer: Medicare Other | Source: Ambulatory Visit | Attending: Family Medicine | Admitting: Family Medicine

## 2018-04-19 DIAGNOSIS — Z1231 Encounter for screening mammogram for malignant neoplasm of breast: Secondary | ICD-10-CM

## 2019-03-17 ENCOUNTER — Other Ambulatory Visit: Payer: Self-pay | Admitting: Family Medicine

## 2019-03-21 ENCOUNTER — Other Ambulatory Visit: Payer: Self-pay | Admitting: Family Medicine

## 2019-03-21 DIAGNOSIS — Z1231 Encounter for screening mammogram for malignant neoplasm of breast: Secondary | ICD-10-CM

## 2019-04-24 ENCOUNTER — Ambulatory Visit
Admission: RE | Admit: 2019-04-24 | Discharge: 2019-04-24 | Disposition: A | Discharge status: Home / Self Care | Payer: Medicare Other | Source: Ambulatory Visit | Attending: Family Medicine | Admitting: Family Medicine

## 2019-04-24 DIAGNOSIS — Z1231 Encounter for screening mammogram for malignant neoplasm of breast: Secondary | ICD-10-CM | POA: Diagnosis present

## 2020-04-07 ENCOUNTER — Encounter: Payer: Self-pay | Admitting: Orthopedic Surgery

## 2020-04-07 ENCOUNTER — Other Ambulatory Visit: Payer: Self-pay

## 2020-04-07 ENCOUNTER — Ambulatory Visit (INDEPENDENT_AMBULATORY_CARE_PROVIDER_SITE_OTHER): Payer: Medicare Other | Admitting: Orthopedic Surgery

## 2020-04-07 VITALS — Ht 61.0 in | Wt 223.0 lb

## 2020-04-07 DIAGNOSIS — Z6841 Body Mass Index (BMI) 40.0 and over, adult: Secondary | ICD-10-CM | POA: Diagnosis not present

## 2020-04-07 DIAGNOSIS — S62111A Displaced fracture of triquetrum [cuneiform] bone, right wrist, initial encounter for closed fracture: Secondary | ICD-10-CM | POA: Diagnosis not present

## 2020-04-07 DIAGNOSIS — S62346A Nondisplaced fracture of base of fifth metacarpal bone, right hand, initial encounter for closed fracture: Secondary | ICD-10-CM | POA: Diagnosis not present

## 2020-04-07 DIAGNOSIS — W19XXXA Unspecified fall, initial encounter: Secondary | ICD-10-CM

## 2020-04-07 NOTE — Progress Notes (Signed)
New Patient Visit  Assessment: Jessica Cuevas is a LHD 74 y.o. female with the following: 1.  R Triquetrum avulsion fracture 2.  R Base 5th metacarpal fracture  3.  Possible R TFCC injury  Plan: Jessica Cuevas sustained multiple minimally displaced fractures in her right hand, as well as likely damage to her right TFCC based on physical exam and x-rays in clinic today.  I do think she would benefit from immobilization, and we briefly discussed using a cast for a couple of weeks, but she is not interested.  She was fitted for a removable wrist splint, and advised to use this consistently.  She can remove the splint to ice her wrist, hygiene and gentle range of motion activities.  She is in agreement with this plan.  We will plan to see her back in approximately 2 weeks, at which time I anticipate the swelling and pain will be significantly better.   Follow-up: Return in about 2 weeks (around 04/21/2020).  Subjective:  Chief Complaint  Patient presents with  . Hand Pain    Patient rpeorts when moving pain is a 10/10, today is not as bad,     History of Present Illness: Jessica Cuevas is a 74 y.o. female who presents for evaluation of her right wrist.  3 days ago, she sustained a mechanical fall, and braced herself with her right wrist.  She also sustained injuries to her mouth and her nose.  She was seen at an urgent care facility yesterday, and diagnosed with multiple fractures in her right wrist and hand, and referred to clinic for further treatment.  She was placed in Ace wrap in the clinic, but she had a wrist brace at home, which she has been using to support her wrist.  She has been icing the wrist, but has noted significant swelling and bruising in her right hand.   Review of Systems: No fevers or chills No numbness or tingling No chest pain No shortness of breath No bowel or bladder dysfunction No GI distress No headaches   Medical History:  Past Medical History:   Diagnosis Date  . Diabetes mellitus without complication (HCC)   . Hyperlipidemia   . Hypertension     Past Surgical History:  Procedure Laterality Date  . BREAST EXCISIONAL BIOPSY Left    benign excision    Family History  Problem Relation Age of Onset  . Breast cancer Neg Hx    Social History   Tobacco Use  . Smoking status: Never Smoker  . Smokeless tobacco: Never Used  Substance Use Topics  . Alcohol use: No    Allergies  Allergen Reactions  . Aloe Rash  . Metformin And Related     Had lactic acidosis  . Penicillins Rash  . Tape Rash    Current Meds  Medication Sig  . aspirin 81 MG chewable tablet Chew by mouth.  . carvedilol (COREG) 6.25 MG tablet Take by mouth.  . Cholecalciferol (VITAMIN D3) 5000 units TABS Take 5,000 Units by mouth daily.  . Coenzyme Q-10 200 MG CAPS Take by mouth.  Marland Kitchen glimepiride (AMARYL) 4 MG tablet Take by mouth.  . insulin NPH Human (HUMULIN N,NOVOLIN N) 100 UNIT/ML injection Inject into the skin.  Marland Kitchen lisinopril (PRINIVIL,ZESTRIL) 20 MG tablet Take by mouth.  . Magnesium Oxide, Antacid, 500 MG CAPS Take by mouth.  . Multiple Vitamin (MULTIVITAMIN WITH MINERALS) TABS tablet Take 1 tablet by mouth daily.  . Multiple Vitamins-Minerals (PRESERVISION AREDS 2+MULTI VIT PO) Take  1 capsule by mouth 2 (two) times daily.    Objective: Ht 5\' 1"  (1.549 m)   Wt 223 lb (101.2 kg)   BMI 42.14 kg/m   Physical Exam:   General: Alert and oriented, no acute distress Gait: Normal  Evaluation of the right hand demonstrates significant ecchymosis and swelling over the dorsal aspect.  Limited range of motion of her fingers due to pain.  Tenderness to palpation over the distal ulna, TFCC and dorsum of her hand.  She has exquisite pain with ulnar deviation of her wrist.  Fingers are warm and well perfused.      IMAGING: I personally reviewed images previously obtained from the ED   XR of the right hand and wrist demonstrates an avulsion fracture  of the dorsal triquetrum.  Minimally displaced fracture at base of 5th metacarpal.   New Medications:  No orders of the defined types were placed in this encounter.     , MD  04/07/2020 9:37 AM

## 2020-04-08 ENCOUNTER — Telehealth: Payer: Self-pay | Admitting: Orthopedic Surgery

## 2020-04-08 ENCOUNTER — Ambulatory Visit (INDEPENDENT_AMBULATORY_CARE_PROVIDER_SITE_OTHER): Payer: Medicare Other | Admitting: Orthopedic Surgery

## 2020-04-08 ENCOUNTER — Encounter: Payer: Self-pay | Admitting: Orthopedic Surgery

## 2020-04-08 DIAGNOSIS — S62111D Displaced fracture of triquetrum [cuneiform] bone, right wrist, subsequent encounter for fracture with routine healing: Secondary | ICD-10-CM

## 2020-04-08 NOTE — Telephone Encounter (Signed)
Left a message for patient to call back and come in the office to be seen.

## 2020-04-08 NOTE — Telephone Encounter (Signed)
Jessica Cuevas called saying that she is having more pain today than yesterday.  She wants to speak to someone about it  Please call her  Thanks

## 2020-04-08 NOTE — Telephone Encounter (Signed)
I called the patient and she reports that she is having pain in the right side. She is wearing the brace but its seems to not help much.   She has took some aleve and it takes the edge off. She wants to know if you have any other recommendations. Please advise.

## 2020-04-08 NOTE — Progress Notes (Signed)
Chief Complaint  Patient presents with  . Wrist Pain    Right wrist pain, worse since today    This is a patient of Dr. Dallas Schimke he diagnosed her with a triquetrum wrist fracture TFCC injury she was placed in a small splint she complained of pain call the office.  We called her in since Dr. Dallas Schimke is out of the office and the office is closed tomorrow  I did not see anything clinically to warrant any concern however we placed her in a bigger brace with straps are a little more adjustable and it seemed to satisfy her  She will continue with her regular follow-up on January 12 with Dr. Dallas Schimke

## 2020-04-08 NOTE — Patient Instructions (Signed)
See Dr Salena Saner on Jan 12

## 2020-04-08 NOTE — Telephone Encounter (Signed)
I have called the patient and she is in so much pain and per Dr. Romeo Apple she can come in for a cast. I left her a message to call the office.   Thanks

## 2020-04-10 NOTE — Telephone Encounter (Signed)
Thank you   I think she may be more comfortable in a cast.  We discussed it in clinic and she was not interested at that time.  If she would like to be placed in cast, please have her come back to clinic.  Currently, I have one surgery on Monday, so we could schedule a time for her in the morning, or we can just schedule a nurse visit and have her placed in an ulnar gutter cast.   Let me know after you have discussed this with her. ]   Loraine Leriche A. Dallas Schimke, MD MS [facility] [address] [place],  [place]  [address] Phone: [phone] Fax: [phone]

## 2020-04-12 NOTE — Telephone Encounter (Signed)
The patient  come by the office before the holiday and wanted another splint and we got her fitted. She does want a cast at this time. If she does she will let us know.

## 2020-04-21 ENCOUNTER — Ambulatory Visit (INDEPENDENT_AMBULATORY_CARE_PROVIDER_SITE_OTHER): Payer: Medicare Other | Admitting: Orthopedic Surgery

## 2020-04-21 ENCOUNTER — Encounter: Payer: Self-pay | Admitting: Orthopedic Surgery

## 2020-04-21 ENCOUNTER — Other Ambulatory Visit: Payer: Self-pay

## 2020-04-21 ENCOUNTER — Ambulatory Visit: Payer: Medicare Other

## 2020-04-21 VITALS — BP 164/76 | HR 80 | Ht 61.0 in | Wt 226.5 lb

## 2020-04-21 DIAGNOSIS — S62111D Displaced fracture of triquetrum [cuneiform] bone, right wrist, subsequent encounter for fracture with routine healing: Secondary | ICD-10-CM

## 2020-04-21 NOTE — Progress Notes (Signed)
[facility] Clinic Return  Assessment: Jessica Cuevas is a 75 y.o. female with the following: 1.  R Triquetrum avulsion fracture 2.  R Base 5th metacarpal fracture  3.  Possible R TFCC injury  Plan: Jessica Cuevas continues to get better.  After transitioning to a larger removable splint, she felt more stable and her pain has improved.  She has been removing the splint for some ADLs, but notes she has difficulty grabbing heavier items.  I have recommended she continue to use the splint and avoid heavier items for the next couple of weeks and then she can gradually increase the use of her right hand.  She may have sustained an injury to the TFCC which will likely be the slowest to improve.  If she has difficulty with motion moving forward, we could consider a referral to OT.  She will contact the clinic if she has any issues or wishes to schedule a follow up appointment.   Body mass index is 42.8 kg/m.  Follow-up: Return if symptoms worsen or fail to improve.   Subjective:  Chief Complaint  Patient presents with  . Wrist Injury    R/can move fingers now but my wrist hurts. This brace is rubbing my wrist.    History of Present Illness: Jessica Cuevas is a 75 y.o. LHD female who returns to clinic for repeat evaluation of her right wrist.  She sustained multiple injuries to the hand and wrist.  She was placed in a removable wrist splint, but returned to clinic a few days later to be fitted for a different brace.  She tolerated the second splint well.  Her pain has improved.  She has improved motion in her hands, but still has pain with wrist motion.  She has difficulty when she tries to lift heavier items, like a pot or pan when doing the dishes.  She has some irritation on her wrist from one of the straps.   Review of Systems: No fevers or chills No numbness or tingling No chest pain No shortness of breath No bowel or bladder dysfunction No GI distress No  headaches    Objective: BP (!) 164/76   Pulse 80   Ht 5\' 1"  (1.549 m)   Wt 226 lb 8 oz (102.7 kg)   BMI 42.80 kg/m   Physical Exam:  Right wrist and hand with improved swelling.  Near full ROM of her fingers without discomfort.  Pain with ulnar deviation.  No discomfort with radial deviation.  Pain with flexion and extension of the wrist.  Fingers are warm and well perfused.   IMAGING: I personally ordered and reviewed the following images:  XR right wrist demonstrates no acute injuries.  Previously identified avulsion fracture off the triquetrum is without interval displacement.  No obvious injury to the distal ulna  Impression: healing avulsion fracture to the right hand.   , MD 04/21/2020 2:36 PM

## 2020-09-23 ENCOUNTER — Other Ambulatory Visit (HOSPITAL_COMMUNITY): Payer: Self-pay | Admitting: Neurology

## 2020-09-23 DIAGNOSIS — G3184 Mild cognitive impairment, so stated: Secondary | ICD-10-CM

## 2020-10-04 ENCOUNTER — Ambulatory Visit (HOSPITAL_COMMUNITY)
Admission: RE | Admit: 2020-10-04 | Discharge: 2020-10-04 | Disposition: A | Discharge status: Home / Self Care | Payer: Medicare Other | Source: Ambulatory Visit | Attending: Neurology | Admitting: Neurology

## 2020-10-04 ENCOUNTER — Other Ambulatory Visit: Payer: Self-pay

## 2020-10-04 DIAGNOSIS — G3184 Mild cognitive impairment, so stated: Secondary | ICD-10-CM | POA: Diagnosis present

## 2021-06-15 ENCOUNTER — Emergency Department (HOSPITAL_COMMUNITY): Payer: Medicare Other

## 2021-06-15 ENCOUNTER — Ambulatory Visit
Admission: EM | Admit: 2021-06-15 | Discharge: 2021-06-15 | Disposition: A | Discharge status: Home / Self Care | Payer: Medicare Other

## 2021-06-15 ENCOUNTER — Encounter (HOSPITAL_COMMUNITY): Payer: Self-pay

## 2021-06-15 ENCOUNTER — Other Ambulatory Visit: Payer: Self-pay

## 2021-06-15 ENCOUNTER — Emergency Department (HOSPITAL_COMMUNITY)
Admission: EM | Admit: 2021-06-15 | Discharge: 2021-06-15 | Disposition: A | Discharge status: Home / Self Care | Payer: Medicare Other | Attending: Emergency Medicine | Admitting: Emergency Medicine

## 2021-06-15 DIAGNOSIS — S0012XA Contusion of left eyelid and periocular area, initial encounter: Secondary | ICD-10-CM | POA: Diagnosis not present

## 2021-06-15 DIAGNOSIS — S0512XA Contusion of eyeball and orbital tissues, left eye, initial encounter: Secondary | ICD-10-CM

## 2021-06-15 DIAGNOSIS — E119 Type 2 diabetes mellitus without complications: Secondary | ICD-10-CM | POA: Diagnosis not present

## 2021-06-15 DIAGNOSIS — Z7982 Long term (current) use of aspirin: Secondary | ICD-10-CM | POA: Diagnosis not present

## 2021-06-15 DIAGNOSIS — R519 Headache, unspecified: Secondary | ICD-10-CM | POA: Diagnosis not present

## 2021-06-15 DIAGNOSIS — S0510XA Contusion of eyeball and orbital tissues, unspecified eye, initial encounter: Secondary | ICD-10-CM

## 2021-06-15 DIAGNOSIS — Z794 Long term (current) use of insulin: Secondary | ICD-10-CM | POA: Insufficient documentation

## 2021-06-15 DIAGNOSIS — W19XXXA Unspecified fall, initial encounter: Secondary | ICD-10-CM | POA: Insufficient documentation

## 2021-06-15 DIAGNOSIS — H5712 Ocular pain, left eye: Secondary | ICD-10-CM | POA: Diagnosis not present

## 2021-06-15 DIAGNOSIS — S00202A Unspecified superficial injury of left eyelid and periocular area, initial encounter: Secondary | ICD-10-CM | POA: Diagnosis present

## 2021-06-15 NOTE — ED Provider Notes (Signed)
[facility] EMERGENCY DEPARTMENT Provider Note   CSN: [id number] Arrival date & time: 06/15/21  1931     History  Chief Complaint  Patient presents with   Eye Pain    Left    Jessica Cuevas is a 76 y.o. female.  Patient had fall outside.  She fell trying to bring her trash can in.  Hit her left eye kind of on the concrete.  A little bit of bruising to her left hand but she is not concerned about that.  She went to urgent care they referred her in for CT scans because of all the ecchymosis around the left eye.  Patient wears glasses glasses were injured.  But she denies any eyeball pain or any significant visual changes.  Patient is not on any blood thinner.  Past medical history is significant for diabetes without complications hypertension hyperlipidemia.  Her primary care doctor is in the [place] area.      Home Medications Prior to Admission medications   Medication Sig Start Date End Date Taking? Authorizing Provider  alendronate (FOSAMAX) 70 MG tablet Take by mouth. 06/21/20 06/21/21 Yes [provider]  aspirin 81 MG chewable tablet Chew by mouth.   Yes [provider]  carvedilol (COREG) 3.125 MG tablet Take 1 tablet by mouth 2 (two) times daily. 08/25/20  Yes [provider]  Cholecalciferol (VITAMIN D3) 5000 units TABS Take 5,000 Units by mouth daily.   Yes [provider]  Coenzyme Q-10 200 MG CAPS Take by mouth.   Yes [provider]  donepezil (ARICEPT) 5 MG tablet Take 5 mg by mouth at bedtime. 05/18/21  Yes [provider]  insulin NPH-regular Human (70-30) 100 UNIT/ML injection Inject 50-70 Units into the skin 2 (two) times daily with a meal. Use as directed per blood sugar levels.   Yes [provider]  lisinopril (ZESTRIL) 10 MG tablet Take 1 tablet by mouth daily. 02/21/21  Yes [provider]  Magnesium Oxide, Antacid, 500 MG CAPS Take by mouth.   Yes [provider]  Melatonin 3 MG  CAPS Take 6 mg by mouth at bedtime.   Yes [provider]  Multiple Vitamin (MULTIVITAMIN WITH MINERALS) TABS tablet Take 1 tablet by mouth daily.   Yes [provider]  Multiple Vitamins-Minerals (PRESERVISION AREDS 2+MULTI VIT PO) Take 1 capsule by mouth 2 (two) times daily.   Yes [provider]  Omega-3 Fatty Acids (FISH OIL) 1000 MG CAPS Take 1 capsule by mouth daily.   Yes [provider]      Allergies    Aloe, Metformin and related, Penicillins, and Tape    Review of Systems   Review of Systems  Constitutional:  Negative for chills and fever.  HENT:  Positive for facial swelling. Negative for ear pain and sore throat.   Eyes:  Negative for photophobia, pain, discharge, redness and visual disturbance.  Respiratory:  Negative for cough and shortness of breath.   Cardiovascular:  Negative for chest pain and palpitations.  Gastrointestinal:  Negative for abdominal pain and vomiting.  Genitourinary:  Negative for dysuria and hematuria.  Musculoskeletal:  Negative for arthralgias and back pain.  Skin:  Negative for color change and rash.  Neurological:  Negative for seizures and syncope.  All other systems reviewed and are negative.  Physical Exam Updated Vital Signs BP (!) 189/145    Pulse 87    Temp 98.2 F (36.8 C) (Oral)    Resp 19  Ht 1.549 m (5\' 1" )    Wt 106.6 kg    SpO2 98%    BMI 44.40 kg/m  Physical Exam Vitals and nursing note reviewed.  Constitutional:      General: She is not in acute distress.    Appearance: Normal appearance. She is well-developed.  HENT:     Head: Normocephalic.     Comments: Patient with a lot of bruising to the left upper eyelid and lateral part of the eye.  A very slight abrasion.  No laceration.    Mouth/Throat:     Comments: Uvula midline.  No erythema no exudate.  Tonsils at least visually and the oropharynx do not appear enlarged. Eyes:     Extraocular Movements: Extraocular movements intact.      Conjunctiva/sclera: Conjunctivae normal.     Pupils: Pupils are equal, round, and reactive to light.     Comments: Extraocular muscles are intact.  Pupils are normal and reactive.  Left eye has no hyphema.  No eye pain  Neck:     Vascular: No carotid bruit.  Cardiovascular:     Rate and Rhythm: Normal rate and regular rhythm.     Heart sounds: No murmur heard. Pulmonary:     Effort: Pulmonary effort is normal. No respiratory distress.     Breath sounds: Normal breath sounds.  Abdominal:     Palpations: Abdomen is soft.     Tenderness: There is no abdominal tenderness.  Musculoskeletal:        General: No swelling.     Cervical back: Normal range of motion and neck supple.     Comments: Slight abrasion to the ulnar aspect of the left hand.  No significant swelling.  Good range of motion.  Neurovascularly intact.  Skin:    General: Skin is warm and dry.     Capillary Refill: Capillary refill takes less than 2 seconds.  Neurological:     General: No focal deficit present.     Mental Status: She is alert and oriented to person, place, and time.     Cranial Nerves: No cranial nerve deficit.     Sensory: No sensory deficit.     Motor: No weakness.  Psychiatric:        Mood and Affect: Mood normal.    ED Results / Procedures / Treatments   Labs (all labs ordered are listed, but only abnormal results are displayed) Labs Reviewed - No data to display  EKG None  Radiology CT Head Wo Contrast  Result Date: 06/15/2021 CLINICAL DATA:  Orbital head trauma. EXAM: CT HEAD WITHOUT CONTRAST CT MAXILLOFACIAL WITHOUT CONTRAST TECHNIQUE: Multidetector CT imaging of the head and maxillofacial structures were performed using the standard protocol without intravenous contrast. Multiplanar CT image reconstructions of the maxillofacial structures were also generated. RADIATION DOSE REDUCTION: This exam was performed according to the departmental dose-optimization program which includes automated  exposure control, adjustment of the mA and/or kV according to patient size and/or use of iterative reconstruction technique. COMPARISON:  MR brain 10/04/2020. No prior facial imaging for comparison but the MRI does include most of the face. FINDINGS: CT HEAD FINDINGS Brain: Again noted are mild-to-moderate cerebral atrophy and atrophic ventriculomegaly and mild small vessel disease of the cerebral white matter. There is slight cerebellar atrophy. No asymmetry is seen concerning for an acute infarct, hemorrhage or mass. There is no old territorial infarct; there is no midline shift. Vascular: There are moderate patchy calcifications in the carotid siphons but no hyperdense  central vessels. Small calcification distal left vertebral artery. Skull: Normal. Negative for fracture or focal lesion. There is scalp swelling in the left forehead continuing over the left sided preorbital soft tissues. Other: None. CT MAXILLOFACIAL FINDINGS Osseous: No fracture or mandibular dislocation. No destructive process. Osteopenia. Orbits: There is moderate swelling in the left preorbital soft tissues but no orbital fracture , orbital hematoma or stranding. The globes and optic nerves are symmetric. No extraocular muscle thickening is seen. Sinuses: Clear. Soft tissues: As above there is scalp swelling in left forehead continuing over the left orbit. Both palatine tonsils are prominent for age, nearly abutting the uvula. Direct visualization is recommended. No underlying fluid collection is seen. There are bilateral high carotid bifurcations at the level of C2-3 with heavily calcified proximal cervical ICAs and likely flow-limiting calcific stenoses bilaterally. Unremarkable parotid and submandibular glands. IMPRESSION: 1. No acute intracranial CT findings or depressed skull fractures. 2. Soft tissue swelling without evidence of orbital fractures. The globes are symmetric and intact. Facial osteopenia. 3. Prominent palatine tonsils for a  76 year old. Direct visualization recommended. 4. Carotid atherosclerosis with high cervical carotid bifurcations and heavily calcified proximal cervical ICAs. There are likely flow-limiting stenoses of both proximal cervical ICAs. Clinical correlation. Electronically Signed   By: Almira Bar M.D.   On: 06/15/2021 22:12   CT Maxillofacial WO CM  Result Date: 06/15/2021 CLINICAL DATA:  Orbital head trauma. EXAM: CT HEAD WITHOUT CONTRAST CT MAXILLOFACIAL WITHOUT CONTRAST TECHNIQUE: Multidetector CT imaging of the head and maxillofacial structures were performed using the standard protocol without intravenous contrast. Multiplanar CT image reconstructions of the maxillofacial structures were also generated. RADIATION DOSE REDUCTION: This exam was performed according to the departmental dose-optimization program which includes automated exposure control, adjustment of the mA and/or kV according to patient size and/or use of iterative reconstruction technique. COMPARISON:  MR brain 10/04/2020. No prior facial imaging for comparison but the MRI does include most of the face. FINDINGS: CT HEAD FINDINGS Brain: Again noted are mild-to-moderate cerebral atrophy and atrophic ventriculomegaly and mild small vessel disease of the cerebral white matter. There is slight cerebellar atrophy. No asymmetry is seen concerning for an acute infarct, hemorrhage or mass. There is no old territorial infarct; there is no midline shift. Vascular: There are moderate patchy calcifications in the carotid siphons but no hyperdense central vessels. Small calcification distal left vertebral artery. Skull: Normal. Negative for fracture or focal lesion. There is scalp swelling in the left forehead continuing over the left sided preorbital soft tissues. Other: None. CT MAXILLOFACIAL FINDINGS Osseous: No fracture or mandibular dislocation. No destructive process. Osteopenia. Orbits: There is moderate swelling in the left preorbital soft tissues  but no orbital fracture , orbital hematoma or stranding. The globes and optic nerves are symmetric. No extraocular muscle thickening is seen. Sinuses: Clear. Soft tissues: As above there is scalp swelling in left forehead continuing over the left orbit. Both palatine tonsils are prominent for age, nearly abutting the uvula. Direct visualization is recommended. No underlying fluid collection is seen. There are bilateral high carotid bifurcations at the level of C2-3 with heavily calcified proximal cervical ICAs and likely flow-limiting calcific stenoses bilaterally. Unremarkable parotid and submandibular glands. IMPRESSION: 1. No acute intracranial CT findings or depressed skull fractures. 2. Soft tissue swelling without evidence of orbital fractures. The globes are symmetric and intact. Facial osteopenia. 3. Prominent palatine tonsils for a 76 year old. Direct visualization recommended. 4. Carotid atherosclerosis with high cervical carotid bifurcations and heavily calcified proximal cervical ICAs.  There are likely flow-limiting stenoses of both proximal cervical ICAs. Clinical correlation. Electronically Signed   By: Almira Bar M.D.   On: 06/15/2021 22:12    Procedures Procedures    Medications Ordered in ED Medications - No data to display  ED Course/ Medical Decision Making/ A&P                           Medical Decision Making Amount and/or Complexity of Data Reviewed Radiology: ordered.   CT head CT maxillofacial without any acute injuries.  An incidental finding of enlarged Palatine tonsils.  But on exam I do not seem to be particularly enlarged.  They were enlarged bilaterally on the CT scan.  Also fairly significant carotid artery calcification.  Follow-up for this will be important with her primary care doctor patient probably needs Doppler studies.  Her blood pressure is also very elevated here.  That will need to be followed closely.  Patient stable for discharge home. Final  Clinical Impression(s) / ED Diagnoses Final diagnoses:  Fall, initial encounter  Periorbital contusion of left eye, initial encounter    Rx / DC Orders ED Discharge Orders     None         Vanetta Mulders, MD 06/15/21 2318

## 2021-06-15 NOTE — Discharge Instructions (Addendum)
Please present to the hospital as I am concerned that you may have an orbital fracture. This requires a higher level of care than we can provide in the urgent care setting including consideration for a CT scan of the orbit.  ?

## 2021-06-15 NOTE — Discharge Instructions (Signed)
Head CT without any injury to the skull or brain.  Facial CT without any injury to the facial bones or any of the bones around the eye socket.  There were incidental findings of enlarged Palatine tonsils.  And significant narrowing in both carotid arteries.  Would recommend close follow-up with your primary care doctor certainly regarding the carotid arteries.  Also blood pressure was elevated here today.  Understand that is been adjusted recently.  Follow-up with your cardiologist or your primary care doctor for close monitoring of your blood pressure.  Continue your current blood pressure meds. ?

## 2021-06-15 NOTE — ED Triage Notes (Signed)
Pt sent to ed from urgent care for L eye pain following a fall.  UC recommended ct scan to r/o periorbital fx.  Pt is alert and oriented.  Resp even and unlabored.  Skin warm and dry.   ?

## 2021-06-15 NOTE — ED Notes (Signed)
Introduced self to patient/family and explained delay.  ?

## 2021-06-15 NOTE — ED Provider Notes (Signed)
?[facility] ? ? ?MRN: [id number] DOB: 1945-08-18 ? ?Subjective:  ? ?Isys Tietje is a 76 y.o. female presenting for acute onset of left upper eyelid pain, swelling and bruising.  Patient suffered an accidental fall today onto pavement.  Her eyeglasses dug into her skin and caused a little cut along the left side of her eye.  Denies loss of consciousness, confusion, weakness, numbness or tingling.  No vision changes.  No photophobia or pain with eye movement. ? ?No current facility-administered medications for this encounter. ? ?Current Outpatient Medications:  ?  aspirin 81 MG chewable tablet, Chew by mouth., Disp: , Rfl:  ?  carvedilol (COREG) 6.25 MG tablet, Take by mouth., Disp: , Rfl:  ?  Cholecalciferol (VITAMIN D3) 5000 units TABS, Take 5,000 Units by mouth daily., Disp: , Rfl:  ?  Coenzyme Q-10 200 MG CAPS, Take by mouth., Disp: , Rfl:  ?  donepezil (ARICEPT) 5 MG tablet, Take 5 mg by mouth at bedtime., Disp: , Rfl:  ?  insulin NPH-regular Human (70-30) 100 UNIT/ML injection, Inject 75 Units into the skin. Use as directed per blood sugar levels., Disp: , Rfl:  ?  lisinopril (PRINIVIL,ZESTRIL) 20 MG tablet, Take by mouth. Pt taking 1/2 tab, Disp: , Rfl:  ?  Magnesium Oxide, Antacid, 500 MG CAPS, Take by mouth., Disp: , Rfl:  ?  Multiple Vitamin (MULTIVITAMIN WITH MINERALS) TABS tablet, Take 1 tablet by mouth daily., Disp: , Rfl:  ?  Multiple Vitamins-Minerals (PRESERVISION AREDS 2+MULTI VIT PO), Take 1 capsule by mouth 2 (two) times daily., Disp: , Rfl:   ? ?Allergies  ?Allergen Reactions  ? Aloe Rash  ? Metformin And Related   ?  Had lactic acidosis  ? Penicillins Rash  ? Tape Rash  ? ? ?Past Medical History:  ?Diagnosis Date  ? Diabetes mellitus without complication (HCC)   ? Hyperlipidemia   ? Hypertension   ?  ? ?Past Surgical History:  ?Procedure Laterality Date  ? BREAST EXCISIONAL BIOPSY Left   ? benign excision  ? ? ?Family History  ?Problem Relation Age of Onset  ? Breast cancer  Neg Hx   ? ? ?Social History  ? ?Tobacco Use  ? Smoking status: Never  ? Smokeless tobacco: Never  ?Substance Use Topics  ? Alcohol use: No  ? Drug use: Never  ? ? ?ROS ? ? ?Objective:  ? ?Vitals: ?BP (!) 187/92 (BP Location: Right Arm)   Pulse 84   Temp 98 ?F (36.7 ?C) (Oral)   Resp 18   SpO2 96%  ? ?Physical Exam ?Constitutional:   ?   General: She is not in acute distress. ?   Appearance: Normal appearance. She is well-developed. She is not ill-appearing, toxic-appearing or diaphoretic.  ?HENT:  ?   Head: Normocephalic and atraumatic.  ?   Nose: Nose normal.  ?   Mouth/Throat:  ?   Mouth: Mucous membranes are moist.  ?Eyes:  ?   General: Lids are everted, no foreign bodies appreciated. No scleral icterus.    ?   Right eye: No foreign body, discharge or hordeolum.     ?   Left eye: No foreign body, discharge or hordeolum.  ?   Extraocular Movements: Extraocular movements intact.  ?   Left eye: Normal extraocular motion and no nystagmus.  ?   Conjunctiva/sclera:  ?   Left eye: Left conjunctiva is not injected. No chemosis, exudate or hemorrhage. ? ?Cardiovascular:  ?   Rate and Rhythm:  Normal rate.  ?Pulmonary:  ?   Effort: Pulmonary effort is normal.  ?Skin: ?   General: Skin is warm and dry.  ?Neurological:  ?   General: No focal deficit present.  ?   Mental Status: She is alert and oriented to person, place, and time.  ?   Cranial Nerves: No cranial nerve deficit.  ?   Motor: No weakness.  ?   Coordination: Coordination normal.  ?   Gait: Gait normal.  ?Psychiatric:     ?   Mood and Affect: Mood normal.     ?   Behavior: Behavior normal.     ?   Thought Content: Thought content normal.     ?   Judgment: Judgment normal.  ? ? ?Assessment and Plan :  ? ?PDMP not reviewed this encounter. ? ?1. Acute left eye pain   ?2. Accidental fall, initial encounter   ?3. Ecchymosis of eye, initial encounter   ?4. Eye contusion, left, initial encounter   ? ?Emphasized need for further evaluation and care than we can provide  in the urgent care setting including consideration for orbital CT scan to rule out an orbital fracture.  I discussed this with patient and her family member.  They verbalized understanding, contract for safety and will present to the emergency room now by personal vehicle. ?  ?Wallis Bamberg, PA-C ?06/15/21 1915 ? ?

## 2021-06-15 NOTE — ED Triage Notes (Signed)
Pt presents with swelling, pain and discoloration in left eye since 1630 [address] when she fell on the pavement and the glasses hit the left eye. Pt denies headache, nausea, vomiting, dizziness, vision changes.  ?

## 2021-06-15 NOTE — ED Notes (Signed)
Patient is being discharged from the Urgent Care and sent to the Emergency Department via POV . Per PA, patient is in need of higher level of care due to potential orbital fracture/CT scan. Patient is aware and verbalizes understanding of plan of care.  ?Vitals:  ? 06/15/21 1836  ?BP: (!) 187/92  ?Pulse: 84  ?Resp: 18  ?Temp: 98 ?F (36.7 ?C)  ?SpO2: 96%  ?  ?

## 2021-08-24 ENCOUNTER — Encounter: Payer: Medicare Other | Attending: Internal Medicine | Admitting: *Deleted

## 2021-08-24 ENCOUNTER — Encounter: Payer: Self-pay | Admitting: *Deleted

## 2021-08-24 VITALS — BP 140/82 | Ht 61.0 in | Wt 233.8 lb

## 2021-08-24 DIAGNOSIS — Z713 Dietary counseling and surveillance: Secondary | ICD-10-CM | POA: Diagnosis not present

## 2021-08-24 DIAGNOSIS — E119 Type 2 diabetes mellitus without complications: Secondary | ICD-10-CM | POA: Diagnosis present

## 2021-08-24 DIAGNOSIS — Z794 Long term (current) use of insulin: Secondary | ICD-10-CM

## 2021-08-24 DIAGNOSIS — Z6841 Body Mass Index (BMI) 40.0 and over, adult: Secondary | ICD-10-CM | POA: Insufficient documentation

## 2021-08-24 DIAGNOSIS — E1165 Type 2 diabetes mellitus with hyperglycemia: Secondary | ICD-10-CM

## 2021-08-24 NOTE — Patient Instructions (Signed)
Check blood sugars 4 x day before each meal and before bed every day ?Check some blood sugars 2 hrs after meals and as needed with [employer] ?Bring blood sugar records/reader to the next appointment ? ?Exercise:  Walk for    5-10  minutes   3  days a week and gradually increase to 30 minutes 5 x week ? ?Eat 3 meals day,   1-2  snacks a day ?Space meals 4-6 hours apart ?Allow 2-3 hours between meals and snacks ?Don't skip meals ?Limit desserts/sweets ? ?Complete 3 Day Food Record and bring to next appt ? ?Carry fast acting glucose and a snack at all times ?Rotate injection sites ? ?Return for appointment on:  Tuesday September 27, 2021 at 10:30 am with Velna Hatchet (nurse)  ?

## 2021-08-24 NOTE — Progress Notes (Signed)
Diabetes Self-Management Education  Visit Type: First/Initial  Appt. Start Time: 1020 Appt. End Time: 1140  08/24/2021  [name], identified by name and date of birth, is a 76 y.o. female with a diagnosis of Diabetes: Type 2.   ASSESSMENT  Blood pressure 140/82, height 5\' 1"  (1.549 m), weight 233 lb 12.8 oz (106.1 kg). Body mass index is 44.18 kg/m.   Diabetes Self-Management Education - 08/24/21 1201       Visit Information   Visit Type First/Initial      Initial Visit   Diabetes Type Type 2    Date Diagnosed 25 years ago    Are you currently following a meal plan? No    Are you taking your medications as prescribed? Yes      Health Coping   How would you rate your overall health? Good      Psychosocial Assessment   Patient Belief/Attitude about Diabetes Other (comment)   "annoyed - BS up and down"   What is the hardest part about your diabetes right now, causing you the most concern, or is the most worrisome to you about your diabetes?   Making healty food and beverage choices    Self-care barriers None    Self-management support Doctor's office;Family    Patient Concerns Nutrition/Meal planning;Glycemic Control;Monitoring;Weight Control    Special Needs None    Preferred Learning Style Visual;Hands on   talking/discussion   Learning Readiness Ready    How often do you need to have someone help you when you read instructions, pamphlets, or other written materials from your doctor or pharmacy? 1 - Never    What is the last grade level you completed in school? 2 year college      Pre-Education Assessment   Patient understands the diabetes disease and treatment process. Needs Review    Patient understands incorporating nutritional management into lifestyle. Needs Instruction    Patient undertands incorporating physical activity into lifestyle. Needs Instruction    Patient understands using medications safely. Needs Review    Patient understands monitoring blood  glucose, interpreting and using results Needs Review    Patient understands prevention, detection, and treatment of acute complications. Needs Review    Patient understands prevention, detection, and treatment of chronic complications. Needs Review    Patient understands how to develop strategies to address psychosocial issues. Needs Instruction    Patient understands how to develop strategies to promote health/change behavior. Needs Instruction      Complications   Last HgB A1C per patient/outside source 10.4 %   08/11/2021   How often do you check your blood sugar? > 4 times/day   FreeStyle Libre. Avg 209 mg/dL for last 30 days; Above Target 59%, Time in Target 40%, Below Target 1% - 30 days   Number of hypoglycemic episodes per month 1    Can you tell when your blood sugar is low? Yes    What do you do if your blood sugar is low? Glucose tablets    Number of hyperglycemic episodes ( >200mg /dL): Daily    Can you tell when your blood sugar is high? Yes    What do you do if your blood sugar is high? Drinking water    Have you had a dilated eye exam in the past 12 months? Yes    Have you had a dental exam in the past 12 months? No    Are you checking your feet? Yes    How many days per week  are you checking your feet? 7      Dietary Intake   Breakfast toast/coffee or bagels or cream of wheat or eggs    Lunch salad wtih [place], lettuce, carrots, tomatoes    Snack (afternoon) ice cream, cookie    Dinner chicken or beef; potatoes, peas, beans, lentils, pasta, cauliflower, broccoli, zucchini, squash, brussels sprouts, onions, peppers, green beans    Snack (evening) ice cream, cookie    Beverage(s) water, unsweetened green tea, coffee      Activity / Exercise   Activity / Exercise Type ADL's      Patient Education   Previous Diabetes Education Yes (please comment)   when first diagnosed   Disease Pathophysiology Factors that contribute to the development of diabetes;Explored patient's  options for treatment of their diabetes    Healthy Eating Role of diet in the treatment of diabetes and the relationship between the three main macronutrients and blood glucose level;Food label reading, portion sizes and measuring food.;Reviewed blood glucose goals for pre and post meals and how to evaluate the patients' food intake on their blood glucose level.;Meal timing in regards to the patients' current diabetes medication.    Being Active Role of exercise on diabetes management, blood pressure control and cardiac health.    Medications Taught/reviewed insulin/injectables, injection, site rotation, insulin/injectables storage and needle disposal.;Reviewed patients medication for diabetes, action, purpose, timing of dose and side effects.    Monitoring Taught/evaluated CGM (comment);Taught/discussed recording of test results and interpretation of SMBG.;Identified appropriate SMBG and/or A1C goals.    Acute complications Taught prevention, symptoms, and  treatment of hypoglycemia - the 15 rule.    Chronic complications Relationship between chronic complications and blood glucose control    Diabetes Stress and Support Identified and addressed patients feelings and concerns about diabetes;Role of stress on diabetes      Individualized Goals (developed by patient)   Reducing Risk Other (comment)   improve blood sugars, prevent diabetes complications, lose weight     Outcomes   Expected Outcomes Demonstrated interest in learning. Expect positive outcomes    Future DMSE 4-6 wks         Individualized Plan for Diabetes Self-Management Training:   Learning Objective:  Patient will have a greater understanding of diabetes self-management. Patient education plan is to attend individual and/or group sessions per assessed needs and concerns.   Plan:   Patient Instructions  Check blood sugars 4 x day before each meal and before bed every day Check some blood sugars 2 hrs after meals and as needed  with FreeStyle Libre Bring blood sugar records/reader to the next appointment  Exercise:  Walk for    5-10  minutes   3  days a week and gradually increase to 30 minutes 5 x week  Eat 3 meals day,   1-2  snacks a day Space meals 4-6 hours apart Allow 2-3 hours between meals and snacks Don't skip meals Limit desserts/sweets  Complete 3 Day Food Record and bring to next appt  Carry fast acting glucose and a snack at all times Rotate injection sites  Return for appointment on:  Tuesday September 27, 2021 at 10:30 am with Velna HatchetSheila (nurse)   Expected Outcomes:  Demonstrated interest in learning. Expect positive outcomes  Education material provided:  General Meal Planning Guidelines Simple Meal Plan 3 Day Food Record Symptoms, causes and treatments of Hypoglycemia Quick and Balanced Meals Healthy Snack Choices (ADA) FreeStyle Libre AVS  If problems or questions, patient to contact team  via:   Sharion Settler, RN, CCM, CDCES [phone]  Future DSME appointment: 4-6 wks September 27, 2021 with this educator

## 2021-09-08 ENCOUNTER — Encounter (INDEPENDENT_AMBULATORY_CARE_PROVIDER_SITE_OTHER): Payer: Medicare Other | Admitting: Vascular Surgery

## 2021-09-27 ENCOUNTER — Encounter: Payer: Medicare Other | Attending: Internal Medicine | Admitting: *Deleted

## 2021-09-27 ENCOUNTER — Encounter: Payer: Self-pay | Admitting: *Deleted

## 2021-09-27 VITALS — BP 140/80 | Wt 239.0 lb

## 2021-09-27 DIAGNOSIS — Z794 Long term (current) use of insulin: Secondary | ICD-10-CM | POA: Insufficient documentation

## 2021-09-27 DIAGNOSIS — E1165 Type 2 diabetes mellitus with hyperglycemia: Secondary | ICD-10-CM | POA: Insufficient documentation

## 2021-09-27 NOTE — Patient Instructions (Signed)
Check blood sugars before meals, 2 hrs after meals and at bedtime using FreeStyle Libre Bring reader to each MD appointment  Exercise:  Walk  for    10-15  minutes   3  days a week and gradually increase to 30 minutes 5 x week  Eat 3 meals day,   1-2  snacks a day Space meals 4-6 hours apart Include 2-3 servings of carbohydrates (30-45 grams) per meal Continue to limit desserts/sweets (1-2 x week) Don't skip meals - eat at least 1 protein and 1 carbohydrate serving  Carry fast acting glucose and a snack at all times Treat low blood sugars with 15 grams of fast acting glucose

## 2021-09-27 NOTE — Progress Notes (Unsigned)
Diabetes Self-Management Education  Visit Type: Follow-up  Appt. Start Time: 1025 Appt. End Time: 1145  09/27/2021  [name], identified by name and date of birth, is a 76 y.o. female with a diagnosis of Diabetes: Type 2   ASSESSMENT  Blood pressure 140/80, weight 239 lb (108.4 kg). Body mass index is 45.16 kg/m.   Diabetes Self-Management Education - 09/27/21 1500       Visit Information   Visit Type Follow-up      Complications   How often do you check your blood sugar? > 4 times/day   FreeStyle Libre 2 - avg of 156 mg/dL for 30 days; Above target 32%, In target 62% and Below target 6%   Number of hypoglycemic episodes per month 15   readings in the 60's around 2-3 am   Can you tell when your blood sugar is low? Yes   alarm from [employer]   What do you do if your blood sugar is low? has been using glucose tablets but recently eats peanut butter crackers first    Number of hyperglycemic episodes ( >200mg /dL): Daily    Can you tell when your blood sugar is high? Yes   per FreeStyle LIbre   What do you do if your blood sugar is high? drinking water    Have you had a dilated eye exam in the past 12 months? Yes    Have you had a dental exam in the past 12 months? No    Are you checking your feet? Yes    How many days per week are you checking your feet? 7      Dietary Intake   Breakfast 3 meals and 1-2 snacks/day - see 3 Day Food Record      Activity / Exercise   Activity / Exercise Type ADL's      Patient Education   Disease Pathophysiology Definition of diabetes, type 1 and 2, and the diagnosis of diabetes;Factors that contribute to the development of diabetes;Explored patient's options for treatment of their diabetes    Healthy Eating Role of diet in the treatment of diabetes and the relationship between the three main macronutrients and blood glucose level;Food label reading, portion sizes and measuring food.;Carbohydrate counting;Reviewed blood glucose  goals for pre and post meals and how to evaluate the patients' food intake on their blood glucose level.;Meal timing in regards to the patients' current diabetes medication.    Being Active Role of exercise on diabetes management, blood pressure control and cardiac health.    Medications Taught/reviewed insulin/injectables, injection, site rotation, insulin/injectables storage and needle disposal.;Reviewed patients medication for diabetes, action, purpose, timing of dose and side effects.    Monitoring Taught/evaluated CGM (comment);Taught/discussed recording of test results and interpretation of SMBG.;Identified appropriate SMBG and/or A1C goals.    Acute complications Taught prevention, symptoms, and  treatment of hypoglycemia - the 15 rule.    Chronic complications Relationship between chronic complications and blood glucose control    Diabetes Stress and Support Identified and addressed patients feelings and concerns about diabetes;Role of stress on diabetes      Individualized Goals (developed by patient)   Nutrition Follow meal plan discussed;Carb counting    Physical Activity Exercise 3-5 times per week;15 minutes per day    Medications take my medication as prescribed    Monitoring  Consistenly use CGM    Problem Solving Eating Pattern    Reducing Risk examine blood glucose patterns;treat hypoglycemia with 15 grams of carbs if blood  glucose less than 70mg /dL      Post-Education Assessment   Patient understands the diabetes disease and treatment process. Comprehends key points    Patient understands incorporating nutritional management into lifestyle. Needs Review    Patient undertands incorporating physical activity into lifestyle. Needs Review    Patient understands using medications safely. Demonstrates understanding / competency    Patient understands monitoring blood glucose, interpreting and using results Demonstrates understanding / competency    Patient understands prevention,  detection, and treatment of acute complications. Comprehends key points    Patient understands prevention, detection, and treatment of chronic complications. Demonstrates understanding / competency    Patient understands how to develop strategies to address psychosocial issues. Comprehends key points    Patient understands how to develop strategies to promote health/change behavior. Demonstrates understanding / competency      Outcomes   Program Status Completed         Individualized Plan for Diabetes Self-Management Training:   Learning Objective:  Patient will have a greater understanding of diabetes self-management. Patient education plan is to attend individual and/or group sessions per assessed needs and concerns.   Plan:   Patient Instructions  Check blood sugars before meals, 2 hrs after meals and at bedtime using FreeStyle Libre Bring reader to each MD appointment  Exercise:  Walk  for    10-15  minutes   3  days a week and gradually increase to 30 minutes 5 x week  Eat 3 meals day,   1-2  snacks a day Space meals 4-6 hours apart Include 2-3 servings of carbohydrates (30-45 grams) per meal Continue to limit desserts/sweets (1-2 x week) Don't skip meals - eat at least 1 protein and 1 carbohydrate serving  Carry fast acting glucose and a snack at all times Treat low blood sugars with 15 grams of fast acting glucose  Expected Outcomes:  Demonstrated interest in learning. Expect positive outcomes  Education material provided:  Planning a Balanced Meal Making Choices Using Food Labels (ADA)  If problems or questions, patient to contact team via:   , RN, CCM, CDCES [phone]  Future DSME appointment:  PRN

## 2021-09-29 ENCOUNTER — Encounter (INDEPENDENT_AMBULATORY_CARE_PROVIDER_SITE_OTHER): Payer: Self-pay | Admitting: Vascular Surgery

## 2021-09-29 ENCOUNTER — Ambulatory Visit (INDEPENDENT_AMBULATORY_CARE_PROVIDER_SITE_OTHER): Payer: Medicare Other | Admitting: Vascular Surgery

## 2021-09-29 VITALS — BP 121/77 | HR 123 | Resp 17 | Ht 61.0 in | Wt 238.0 lb

## 2021-09-29 DIAGNOSIS — I6523 Occlusion and stenosis of bilateral carotid arteries: Secondary | ICD-10-CM | POA: Diagnosis not present

## 2021-09-29 DIAGNOSIS — E1159 Type 2 diabetes mellitus with other circulatory complications: Secondary | ICD-10-CM | POA: Diagnosis not present

## 2021-09-29 DIAGNOSIS — I83813 Varicose veins of bilateral lower extremities with pain: Secondary | ICD-10-CM | POA: Diagnosis not present

## 2021-09-29 DIAGNOSIS — I10 Essential (primary) hypertension: Secondary | ICD-10-CM

## 2021-09-29 DIAGNOSIS — E782 Mixed hyperlipidemia: Secondary | ICD-10-CM

## 2021-09-30 ENCOUNTER — Other Ambulatory Visit: Payer: Self-pay | Admitting: Family Medicine

## 2021-09-30 DIAGNOSIS — Z1231 Encounter for screening mammogram for malignant neoplasm of breast: Secondary | ICD-10-CM

## 2021-10-02 ENCOUNTER — Encounter (INDEPENDENT_AMBULATORY_CARE_PROVIDER_SITE_OTHER): Payer: Self-pay | Admitting: Vascular Surgery

## 2021-10-02 DIAGNOSIS — E785 Hyperlipidemia, unspecified: Secondary | ICD-10-CM | POA: Insufficient documentation

## 2021-10-02 DIAGNOSIS — I10 Essential (primary) hypertension: Secondary | ICD-10-CM | POA: Insufficient documentation

## 2021-10-02 DIAGNOSIS — I6529 Occlusion and stenosis of unspecified carotid artery: Secondary | ICD-10-CM | POA: Insufficient documentation

## 2021-10-02 NOTE — Progress Notes (Signed)
MRN : [id number]  Jessica Cuevas is a 76 y.o. ([birth year]) female who presents with chief complaint of blocked carotids.  History of Present Illness: The patient is seen for evaluation of carotid stenosis. The carotid stenosis was identified after a noncontrast CT of the head and neck secondary to head trauma.  The patient describes visual changes and dizziness. There is no recent history of  focal motor deficits. There is no prior documented CVA.  There is no history of migraine headaches. There is no history of seizures.  The patient is taking enteric-coated aspirin 81 mg daily.  Varicose veins have not been much of an issue  No recent shortening of the patient's walking distance or new symptoms consistent with claudication.  No history of rest pain symptoms. No new ulcers or wounds of the lower extremities have occurred.  There is no history of DVT, PE or superficial thrombophlebitis. No recent episodes of angina or shortness of breath documented.    Current Meds  Medication Sig   aspirin 81 MG chewable tablet Chew 81 mg by mouth daily.   carvedilol (COREG) 3.125 MG tablet Take 1 tablet by mouth 2 (two) times daily.   Cholecalciferol (VITAMIN D3) 5000 units TABS Take 5,000 Units by mouth daily.   Coenzyme Q-10 200 MG CAPS Take 1 capsule by mouth daily.   insulin NPH-regular Human (70-30) 100 UNIT/ML injection Inject 75-85 Units into the skin 2 (two) times daily with a meal. Use as directed per blood sugar levels.   lisinopril (ZESTRIL) 10 MG tablet Take 1 tablet by mouth daily.   Magnesium Oxide, Antacid, 500 MG CAPS Take 1 capsule by mouth daily.   Melatonin 3 MG CAPS Take 6 mg by mouth at bedtime.   Multiple Vitamin (MULTIVITAMIN WITH MINERALS) TABS tablet Take 1 tablet by mouth daily.   Multiple Vitamins-Minerals (PRESERVISION AREDS 2+MULTI VIT PO) Take 1 capsule by mouth 2 (two) times daily.   Omega-3 Fatty Acids (FISH OIL) 1000 MG CAPS Take 1 capsule by mouth daily.     Past Medical History:  Diagnosis Date   Diabetes mellitus without complication (HCC)    Hyperlipidemia    Hypertension     Past Surgical History:  Procedure Laterality Date   BREAST EXCISIONAL BIOPSY Left    benign excision    Social History Social History   Tobacco Use   Smoking status: Never   Smokeless tobacco: Never  Substance Use Topics   Alcohol use: No   Drug use: Never    Family History Family History  Problem Relation Age of Onset   Breast cancer Neg Hx   No family history of bleeding/clotting disorders, porphyria or autoimmune disease   Allergies  Allergen Reactions   Aloe Rash   Metformin And Related     Had lactic acidosis   Penicillins Rash   Tape Rash     REVIEW OF SYSTEMS (Negative unless checked)  Constitutional: [] Weight loss  [] Fever  [] Chills Cardiac: [] Chest pain   [] Chest pressure   [] Palpitations   [] Shortness of breath when laying flat   [] Shortness of breath with exertion. Vascular:  [] Pain in legs with walking   [] Pain in legs at rest  [] History of DVT   [] Phlebitis   [] Swelling in legs   [x] Varicose veins   [] Non-healing ulcers Pulmonary:   [] Uses home oxygen   [] Productive cough   [] Hemoptysis   [] Wheeze  [] COPD   [] Asthma Neurologic:  [x] Dizziness   [] Seizures   [] History of stroke   []   History of TIA  [] Aphasia   [x] Vissual changes   [] Weakness or numbness in arm   [] Weakness or numbness in leg Musculoskeletal:   [] Joint swelling   [x] Joint pain   [] Low back pain Hematologic:  [] Easy bruising  [] Easy bleeding   [] Hypercoagulable state   [] Anemic Gastrointestinal:  [] Diarrhea   [] Vomiting  [] Gastroesophageal reflux/heartburn   [] Difficulty swallowing. Genitourinary:  [] Chronic kidney disease   [] Difficult urination  [] Frequent urination   [] Blood in urine Skin:  [] Rashes   [] Ulcers  Psychological:  [] History of anxiety   []  History of major depression.  Physical Examination  Vitals:   09/29/21 1543  BP: 121/77  Pulse: (!) 123   Resp: 17  Weight: 238 lb (108 kg)  Height: 5\' 1"  (1.549 m)   Body mass index is 44.97 kg/m. Gen: WD/WN, NAD Head: [place]/AT, No temporalis wasting.  Ear/Nose/Throat: Hearing grossly intact, nares w/o erythema or drainage, poor dentition Eyes: PER, EOMI, sclera nonicteric.  Neck: Supple, no masses.  No bruit or JVD.  Pulmonary:  Good air movement, clear to auscultation bilaterally, no use of accessory muscles.  Cardiac: RRR, normal S1, S2, no Murmurs. Vascular: left carotid bruit noted,  moderate varicose veins bilaterally with mild venous insufficiency Vessel Right Left  Radial Palpable Palpable  Carotid Palpable Palpable  PT Palpable Palpable  DP Palpable Palpable  Gastrointestinal: soft, non-distended. No guarding/no peritoneal signs.  Musculoskeletal: M/S 5/5 throughout.  No deformity or atrophy.  Neurologic: CN 2-12 intact. Pain and light touch intact in extremities.  Symmetrical.  Speech is fluent. Motor exam as listed above. Psychiatric: Judgment intact, Mood & affect appropriate for pt's clinical situation. Dermatologic: No rashes or ulcers noted.  No changes consistent with cellulitis. Lymph : No Cervical lymphadenopathy, no lichenification or skin changes of chronic lymphedema.  CBC No results found for: "WBC", "HGB", "HCT", "MCV", "PLT"  BMET No results found for: "NA", "K", "CL", "CO2", "GLUCOSE", "BUN", "CREATININE", "CALCIUM", "GFRNONAA", "GFRAA" CrCl cannot be calculated (No successful lab value found.).  COAG No results found for: "INR", "PROTIME"  Assessment/Plan 1. Symptomatic stenosis of both carotid arteries without infarction The patient remains asymptomatic with respect to the carotid stenosis.  However, the patient has now progressed and has a lesion the is >70%.  Patient should undergo CT angiography of the carotid arteries to define the degree of stenosis of the internal carotid arteries bilaterally and the anatomic suitability for surgery vs.  intervention.  If the patient does indeed need surgery cardiac clearance will be required, once cleared the patient will be scheduled for surgery.  The risks, benefits and alternative therapies were reviewed in detail with the patient.  All questions were answered.  The patient agrees to proceed with imaging.  Continue antiplatelet therapy as prescribed. Continue management of CAD, HTN and Hyperlipidemia. Healthy heart diet, encouraged exercise at least 4 times per week.   - CT ANGIO NECK W OR WO CONTRAST; Future  2. Varicose veins of both lower extremities with pain Recommend:  The patient is complaining of varicose veins.    I have had a long discussion with the patient regarding  varicose veins and why they cause symptoms.  Patient will begin wearing graduated compression stockings on a daily basis, beginning first thing in the morning and removing them in the evening. The patient is instructed specifically not to sleep in the stockings.    The patient  will also begin using over-the-counter analgesics such as Motrin 600 mg po TID to help control the  symptoms as needed.    In addition, behavioral modification including elevation during the day will be initiated, utilizing a recliner was recommended.  The patient is also instructed to continue exercising such as walking 4-5 times per week.  At this time the patient wishes to continue conservative therapy and is not interested in more invasive treatments such as laser ablation and sclerotherapy.   3. Type 2 diabetes mellitus with other circulatory complication, unspecified whether long term insulin use (HCC) Continue hypoglycemic medications as already ordered, these medications have been reviewed and there are no changes at this time.  Hgb A1C to be monitored as already arranged by primary service   4. Essential hypertension Continue antihypertensive medications as already ordered, these medications have been reviewed and there are no  changes at this time.   5. Mixed hyperlipidemia Continue statin as ordered and reviewed, no changes at this time     Levora Dredge, MD  10/02/2021 3:54 PM

## 2021-10-05 ENCOUNTER — Encounter: Payer: Self-pay | Admitting: Emergency Medicine

## 2021-10-05 ENCOUNTER — Ambulatory Visit
Admission: EM | Admit: 2021-10-05 | Discharge: 2021-10-05 | Disposition: A | Discharge status: Home / Self Care | Payer: Medicare Other | Attending: Nurse Practitioner | Admitting: Nurse Practitioner

## 2021-10-05 DIAGNOSIS — S40862A Insect bite (nonvenomous) of left upper arm, initial encounter: Secondary | ICD-10-CM | POA: Diagnosis not present

## 2021-10-05 DIAGNOSIS — W57XXXA Bitten or stung by nonvenomous insect and other nonvenomous arthropods, initial encounter: Secondary | ICD-10-CM | POA: Diagnosis not present

## 2021-10-05 MED ORDER — DOXYCYCLINE HYCLATE 100 MG PO TABS: 200.0000 mg | ORAL_TABLET | Freq: Once | ORAL | 0 refills | Status: AC

## 2021-10-05 NOTE — ED Provider Notes (Signed)
[facility]    CSN: [id number] Arrival date & time: 10/05/21  1041      History   Chief Complaint Chief Complaint  Patient presents with   Insect Bite    HPI Jessica Cuevas is a 76 y.o. female.   The history is provided by the patient.   Patient presents after she removed a tick from the left upper arm approximately 1 week ago.  Patient ports the tick was not in place "very long".  She denies fever, chills, abdominal pain, or itching to the site.  She reports that the area where the tick was removed is improving with regard to the redness.  She has not used any medication for the symptoms.  She states that her daughter saw the area and became concerned.  Past Medical History:  Diagnosis Date   Diabetes mellitus without complication (HCC)    Hyperlipidemia    Hypertension     Patient Active Problem List   Diagnosis Date Noted   Carotid stenosis, 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 10/02/2021   Essential hypertension 10/02/2021   Hyperlipidemia 10/02/2021   Diabetes (HCC) 01/19/2017   Varicose veins of both lower extremities with pain 01/19/2017   Lower extremity pain, bilateral 01/19/2017    Past Surgical History:  Procedure Laterality Date   BREAST EXCISIONAL BIOPSY Left    benign excision    OB History   No obstetric history on file.      Home Medications    Prior to Admission medications   Medication Sig Start Date End Date Taking? Authorizing Provider  doxycycline (VIBRA-TABS) 100 MG tablet Take 2 tablets (200 mg total) by mouth once for 1 dose. 10/05/21 10/05/21 Yes Armarion Greek-Warren, Sadie Haber, NP  aspirin 81 MG chewable tablet Chew 81 mg by mouth daily.    [provider]  carvedilol (COREG) 3.125 MG tablet Take 1 tablet by mouth 2 (two) times daily. 08/25/20   [provider]  Cholecalciferol (VITAMIN D3) 5000 units TABS Take 5,000 Units by mouth daily.    [provider]  Coenzyme Q-10 200 MG CAPS Take 1 capsule by mouth daily.     [provider]  donepezil (ARICEPT) 5 MG tablet Take 5 mg by mouth at bedtime. Patient not taking: Reported on 08/24/2021 05/18/21   [provider]  insulin NPH-regular Human (70-30) 100 UNIT/ML injection Inject 75-85 Units into the skin 2 (two) times daily with a meal. Use as directed per blood sugar levels.    [provider]  lisinopril (ZESTRIL) 10 MG tablet Take 1 tablet by mouth daily. 02/21/21   [provider]  Magnesium Oxide, Antacid, 500 MG CAPS Take 1 capsule by mouth daily.    [provider]  Melatonin 3 MG CAPS Take 6 mg by mouth at bedtime.    [provider]  Multiple Vitamin (MULTIVITAMIN WITH MINERALS) TABS tablet Take 1 tablet by mouth daily.    [provider]  Multiple Vitamins-Minerals (PRESERVISION AREDS 2+MULTI VIT PO) Take 1 capsule by mouth 2 (two) times daily.    [provider]  Omega-3 Fatty Acids (FISH OIL) 1000 MG CAPS Take 1 capsule by mouth daily.    [provider]    Family History Family History  Problem Relation Age of Onset   Breast cancer Neg Hx     Social History Social History   Tobacco Use   Smoking status: Never   Smokeless tobacco: Never  Substance Use Topics   Alcohol use: No  Drug use: Never     Allergies   Aloe, Metformin and related, Penicillins, and Tape   Review of Systems Review of Systems PER HPI  Physical Exam Triage Vital Signs ED Triage Vitals [10/05/21 1145]  Enc Vitals Group     BP (!) 147/70     Pulse Rate 76     Resp 18     Temp (!) 97.5 F (36.4 C)     Temp src      SpO2 95 %     Weight      Height      Head Circumference      Peak Flow      Pain Score 0     Pain Loc      Pain Edu?      Excl. in GC?    No data found.  Updated Vital Signs BP (!) 147/70   Pulse 76   Temp (!) 97.5 F (36.4 C)   Resp 18   SpO2 95%   Visual Acuity Right Eye Distance:   Left Eye Distance:   Bilateral Distance:    Right Eye  Near:   Left Eye Near:    Bilateral Near:     Physical Exam Vitals and nursing note reviewed.  Constitutional:      General: She is not in acute distress.    Appearance: Normal appearance.  Eyes:     Extraocular Movements: Extraocular movements intact.     Pupils: Pupils are equal, round, and reactive to light.  Cardiovascular:     Rate and Rhythm: Normal rate and regular rhythm.     Pulses: Normal pulses.     Heart sounds: Normal heart sounds.  Pulmonary:     Effort: Pulmonary effort is normal.     Breath sounds: Normal breath sounds.  Abdominal:     General: Bowel sounds are normal.     Palpations: Abdomen is soft.  Skin:    General: Skin is warm and dry.     Capillary Refill: Capillary refill takes less than 2 seconds.     Comments: Induration noted to the left upper arm. Area  with healing scab, area is flat, surrounded by erythematous base. No drainage,fluctuance, oozing or discharge noted. No bull's eye rash appearance.  Neurological:     General: No focal deficit present.     Mental Status: She is alert and oriented to person, place, and time.  Psychiatric:        Mood and Affect: Mood normal.        Behavior: Behavior normal.      UC Treatments / Results  Labs (all labs ordered are listed, but only abnormal results are displayed) Labs Reviewed - No data to display  EKG   Radiology No results found.  Procedures Procedures (including critical care time)  Medications Ordered in UC Medications - No data to display  Initial Impression / Assessment and Plan / UC Course  I have reviewed the triage vital signs and the nursing notes.  Pertinent labs & imaging results that were available during my care of the patient were reviewed by me and considered in my medical decision making (see chart for details).  Patient presents for tick bite to the left upper arm x one week.  Her vital signs are stable, exam is reassuring. There is a small induration to the left  upper arm where the tick was removed. Patient without other systemic symptoms at this time.  Willl provide prophylactic dose of  doxycycline, although not indicated based on her current symptoms and length of time time was attached.  Supportive care recommendations were provided to the patient along with strict indications of when to follow-up or go to the emergency department.  Final Clinical Impressions(s) / UC Diagnoses   Final diagnoses:  Tick bite of left upper arm, initial encounter     Discharge Instructions      Take medication as prescribed. May take over-the-counter Benadryl or Zyrtec to help with itching. Continue to monitor the area for symptoms such as a bull's-eye rash or erythema migrans.  Also monitor for fever, chills, unusual fatigue, abdominal pain, chest pain, or other concerns.  If you develop any of the symptoms, please go to the emergency department immediately. Follow-up as needed.     ED Prescriptions     Medication Sig Dispense Auth. Provider   doxycycline (VIBRA-TABS) 100 MG tablet Take 2 tablets (200 mg total) by mouth once for 1 dose. 2 tablet Brody Bonneau-Warren, Sadie Haber, NP      PDMP not reviewed this encounter.   Abran Cantor, NP 10/05/21 1235

## 2021-10-05 NOTE — ED Triage Notes (Signed)
Pt is present today with a tick bite on left arm. Pt states that she has irritation no pain or fever.  Pt states that she removed the tick last week

## 2021-10-05 NOTE — Discharge Instructions (Signed)
Take medication as prescribed. May take over-the-counter Benadryl or Zyrtec to help with itching. Continue to monitor the area for symptoms such as a bull's-eye rash or erythema migrans.  Also monitor for fever, chills, unusual fatigue, abdominal pain, chest pain, or other concerns.  If you develop any of the symptoms, please go to the emergency department immediately. Follow-up as needed.

## 2021-10-12 ENCOUNTER — Telehealth (INDEPENDENT_AMBULATORY_CARE_PROVIDER_SITE_OTHER): Payer: Self-pay | Admitting: Vascular Surgery

## 2021-10-12 NOTE — Telephone Encounter (Signed)
LVM for pt advising that her CT is authorized (no PA required - Medicare A/B). Advised her to call Radiology scheduling to make the appt and then call us back at the office to make an appt with Dr. Gilda Crease for CT Results. Nothing further needed at this time.

## 2021-10-27 ENCOUNTER — Ambulatory Visit
Admission: RE | Admit: 2021-10-27 | Discharge: 2021-10-27 | Disposition: A | Discharge status: Home / Self Care | Payer: Medicare Other | Source: Ambulatory Visit | Attending: Family Medicine | Admitting: Family Medicine

## 2021-10-27 DIAGNOSIS — Z1231 Encounter for screening mammogram for malignant neoplasm of breast: Secondary | ICD-10-CM | POA: Diagnosis present

## 2021-11-04 ENCOUNTER — Telehealth (INDEPENDENT_AMBULATORY_CARE_PROVIDER_SITE_OTHER): Payer: Self-pay | Admitting: Vascular Surgery

## 2021-11-04 NOTE — Telephone Encounter (Signed)
ATC pt again to advise that no prior auth is needed for her CT scan ordered by Dr. Gilda Crease. Pt's VM is full so unable to LM. Pt needs to call radiology scheduling and schedule her CT and call us to schedule a CT results appt with Dr. Gilda Crease for after the CT.

## 2021-11-25 ENCOUNTER — Encounter (HOSPITAL_COMMUNITY): Payer: Self-pay

## 2021-11-25 ENCOUNTER — Other Ambulatory Visit: Payer: Self-pay

## 2021-11-25 ENCOUNTER — Emergency Department (HOSPITAL_COMMUNITY): Payer: Medicare Other

## 2021-11-25 ENCOUNTER — Emergency Department (HOSPITAL_COMMUNITY)
Admission: EM | Admit: 2021-11-25 | Discharge: 2021-11-25 | Disposition: A | Discharge status: Home / Self Care | Payer: Medicare Other | Attending: Emergency Medicine | Admitting: Emergency Medicine

## 2021-11-25 DIAGNOSIS — R079 Chest pain, unspecified: Secondary | ICD-10-CM | POA: Diagnosis present

## 2021-11-25 DIAGNOSIS — E119 Type 2 diabetes mellitus without complications: Secondary | ICD-10-CM | POA: Diagnosis not present

## 2021-11-25 DIAGNOSIS — Z794 Long term (current) use of insulin: Secondary | ICD-10-CM | POA: Insufficient documentation

## 2021-11-25 DIAGNOSIS — I10 Essential (primary) hypertension: Secondary | ICD-10-CM | POA: Diagnosis not present

## 2021-11-25 DIAGNOSIS — Z79899 Other long term (current) drug therapy: Secondary | ICD-10-CM | POA: Diagnosis not present

## 2021-11-25 DIAGNOSIS — R0602 Shortness of breath: Secondary | ICD-10-CM | POA: Diagnosis not present

## 2021-11-25 DIAGNOSIS — Z7982 Long term (current) use of aspirin: Secondary | ICD-10-CM | POA: Insufficient documentation

## 2021-11-25 LAB — PROTIME-INR
INR: 1.1 (ref 0.8–1.2)
Prothrombin Time: 14.3 seconds (ref 11.4–15.2)

## 2021-11-25 LAB — BRAIN NATRIURETIC PEPTIDE: B Natriuretic Peptide: 150.9 pg/mL — ABNORMAL HIGH (ref 0.0–100.0)

## 2021-11-25 LAB — CBC
HCT: 45.4 % (ref 36.0–46.0)
Hemoglobin: 15.2 g/dL — ABNORMAL HIGH (ref 12.0–15.0)
MCH: 31.4 pg (ref 26.0–34.0)
MCHC: 33.5 g/dL (ref 30.0–36.0)
MCV: 93.8 fL (ref 80.0–100.0)
Platelets: 187 10*3/uL (ref 150–400)
RBC: 4.84 MIL/uL (ref 3.87–5.11)
RDW: 13.2 % (ref 11.5–15.5)
WBC: 8.3 10*3/uL (ref 4.0–10.5)
nRBC: 0 % (ref 0.0–0.2)

## 2021-11-25 LAB — BASIC METABOLIC PANEL
Anion gap: 11 (ref 5–15)
BUN: 9 mg/dL (ref 8–23)
CO2: 23 mmol/L (ref 22–32)
Calcium: 9.6 mg/dL (ref 8.9–10.3)
Chloride: 104 mmol/L (ref 98–111)
Creatinine, Ser: 0.47 mg/dL (ref 0.44–1.00)
GFR, Estimated: >60 mL/min (ref >60)
Glucose, Bld: 145 mg/dL — ABNORMAL HIGH (ref 70–99)
Potassium: 3.8 mmol/L (ref 3.5–5.1)
Sodium: 138 mmol/L (ref 135–145)

## 2021-11-25 LAB — D-DIMER, QUANTITATIVE: D-Dimer, Quant: 0.49 ug/mL-FEU (ref 0.00–0.50)

## 2021-11-25 LAB — TROPONIN I (HIGH SENSITIVITY)
Troponin I (High Sensitivity): 10 ng/L (ref <18)
Troponin I (High Sensitivity): 10 ng/L (ref <18)

## 2021-11-25 MED ORDER — ASPIRIN 325 MG PO TABS
325.0000 mg | ORAL_TABLET | Freq: Every day | ORAL | Status: DC
  Administered 2021-11-25: 325 mg via ORAL
  Filled 2021-11-25: qty 1

## 2021-11-25 MED ORDER — CARVEDILOL 3.125 MG PO TABS: 3.1250 mg | ORAL_TABLET | Freq: Two times a day (BID) | ORAL | 0 refills | Status: DC

## 2021-11-25 NOTE — ED Notes (Signed)
Patient verbalizes understanding of discharge instructions. Opportunity for questioning and answers were provided. Armband removed by staff, pt discharged from ED ambulatory.   

## 2021-11-25 NOTE — Discharge Instructions (Addendum)
We evaluated you today in the emergency department for your chest pain.   Your lab tests were reassuring and did not show sign of a heart attack at this time.  Your lab tests also did not show sign of a blood clot.  Your x-ray was reassuring and did not show sign of pneumonia or collapsed lung.  Since your pain has resolved, did not find a dangerous cause of your pain, I think it is currently safe to go home.  We discussed staying in the hospital and you also felt comfortable going home.  Given your history, please return to the emergency department should you develop any recurrent symptoms such as chest pain, shortness of breath, or any new symptoms such as nausea, vomiting, sweating, loss of consciousness, fevers, or any other concerning symptoms.

## 2021-11-25 NOTE — ED Triage Notes (Signed)
Started having chest pain around 530 with sob and pain to right arm. Hx of stents and aortic valve replacement

## 2021-11-25 NOTE — ED Provider Triage Note (Signed)
Emergency Medicine Provider Triage Evaluation Note  Jessica Cuevas , a 76 y.o. female  was evaluated in triage.  Pt complains of chest pain, shortness of breath without associated diaphoresis, lightheadedness, nausea.  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 reports significant improvement in pain since arriving to the emergency room.  Patient states she was cleaning when symptoms came on.  Review of Systems  Positive: As above Negative: As above  Physical Exam  BP 107/74 (BP Location: Left Arm)   Pulse 79   Temp 97.9 F (36.6 C) (Oral)   Resp 18   Ht 5\' 1"  (1.549 m)   Wt 108 kg   SpO2 100%   BMI 44.97 kg/m  Gen:   Awake, no distress   Resp:  Normal effort  MSK:   Moves extremities without difficulty  Other:    Medical Decision Making  Medically screening exam initiated at 7:15 PM.  Appropriate orders placed.  [name] was informed that the remainder of the evaluation will be completed by another provider, this initial triage assessment does not replace that evaluation, and the importance of remaining in the ED until their evaluation is complete.  325 mg aspirin ordered.   [name], PA-C 11/25/21 1916

## 2021-11-26 NOTE — ED Provider Notes (Signed)
[facility] EMERGENCY DEPARTMENT Provider Note  CSN: [id number] Arrival date & time: 11/25/21 1845  Chief Complaint(s) Chest Pain  HPI Jessica Cuevas is a 76 y.o. female presenting to the emergency department with chest pain.  Patient reports the pain began today, suddenly, prior to arrival.  She reports the pain began while she was cleaning.  She reports the pain is sharp.  She reports that the pain is significantly improved and almost resolved.  She reports she had some shortness of breath which is now resolved.  She denies any fevers, chills, cough, runny nose, sore throat, recent travel or surgeries, syncope, nausea, vomiting, diaphoresis.  Pain does not feel similar to prior heart attack.   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 Authorizing Provider  aspirin 81 MG chewable tablet Chew 81 mg by mouth daily.    [provider]  carvedilol (COREG) 3.125 MG tablet Take 1 tablet (3.125 mg total) by mouth 2 (two) times daily. 11/25/21   Lonell Grandchild, MD  Cholecalciferol (VITAMIN D3) 5000 units TABS Take 5,000 Units by mouth daily.    [provider]  Coenzyme Q-10 200 MG CAPS Take 1 capsule by mouth daily.    [provider]  donepezil (ARICEPT) 5 MG tablet Take 5 mg by mouth at bedtime. Patient not taking: Reported on 08/24/2021 05/18/21   [provider]  insulin NPH-regular Human (70-30) 100 UNIT/ML injection Inject 75-85  Units into the skin 2 (two) times daily with a meal. Use as directed per blood sugar levels.    [provider]  lisinopril (ZESTRIL) 10 MG tablet Take 1 tablet by mouth daily. 02/21/21   [provider]  Magnesium Oxide, Antacid, 500 MG CAPS Take 1 capsule by mouth daily.    [provider]  Melatonin 3 MG CAPS Take 6 mg by mouth at bedtime.    [provider]  Multiple Vitamin (MULTIVITAMIN WITH MINERALS) TABS tablet Take 1 tablet by mouth daily.    [provider]  Multiple Vitamins-Minerals (PRESERVISION AREDS 2+MULTI VIT PO) Take 1 capsule by mouth 2 (two) times daily.    [provider]  Omega-3 Fatty Acids (FISH OIL) 1000 MG CAPS Take 1 capsule by mouth daily.    [provider]  Past Surgical History Past Surgical History:  Procedure Laterality Date   BREAST EXCISIONAL BIOPSY Left    benign excision   Family History Family History  Problem Relation Age of Onset   Breast cancer Neg Hx     Social History Social History   Tobacco Use   Smoking status: Never   Smokeless tobacco: Never  Vaping Use   Vaping Use: Never used  Substance Use Topics   Alcohol use: No   Drug use: Never   Allergies Aloe, Metformin and related, Penicillins, and Tape  Review of Systems Review of Systems  All other systems reviewed and are negative.   Physical Exam Vital Signs  I have reviewed the triage vital signs BP (!) 171/80   Pulse 89   Temp 98.1 F (36.7 C) (Oral)   Resp 16   Ht 5\' 1"  (1.549 m)   Wt 108 kg   SpO2 95%   BMI 44.97 kg/m  Physical Exam Vitals and nursing note reviewed.  Constitutional:      General: She is not in acute distress.    Appearance: She is well-developed.  HENT:     Head: Normocephalic and atraumatic.     Mouth/Throat:     Mouth: Mucous membranes are moist.   Eyes:     Pupils: Pupils are equal, round, and reactive to light.  Cardiovascular:     Rate and Rhythm: Normal rate and regular rhythm.     Heart sounds: No murmur heard. Pulmonary:     Effort: Pulmonary effort is normal. No respiratory distress.     Breath sounds: Normal breath sounds.  Abdominal:     General: Abdomen is flat.     Palpations: Abdomen is soft.     Tenderness: There is no abdominal tenderness.  Musculoskeletal:        General: No tenderness.     Right lower leg: No edema.     Left lower leg: No edema.  Skin:    General: Skin is warm and dry.  Neurological:     General: No focal deficit present.     Mental Status: She is alert. Mental status is at baseline.  Psychiatric:        Mood and Affect: Mood normal.        Behavior: Behavior normal.     ED Results and Treatments Labs (all labs ordered are listed, but only abnormal results are displayed) Labs Reviewed  CBC - Abnormal; Notable for the following components:      Result Value   Hemoglobin 15.2 (*)    All other components within normal limits  BASIC METABOLIC PANEL - Abnormal; Notable for the following components:   Glucose, Bld 145 (*)    All other components within normal limits  BRAIN NATRIURETIC PEPTIDE - Abnormal; Notable for the following components:   B Natriuretic Peptide 150.9 (*)    All other components within normal limits  PROTIME-INR  D-DIMER, QUANTITATIVE  TROPONIN I (HIGH SENSITIVITY)  TROPONIN I (HIGH SENSITIVITY)  Radiology DG Chest 2 View  Result Date: 11/25/2021 CLINICAL DATA:  Chest pain, sternal pain for 1 day EXAM: CHEST - 2 VIEW COMPARISON:  None Available. FINDINGS: The heart size and mediastinal contours are within normal limits. Both lungs are clear. The visualized skeletal structures are unremarkable. IMPRESSION: No active cardiopulmonary disease.  Electronically Signed   By: Elige Ko M.D.   On: 11/25/2021 20:14    Pertinent labs & imaging results that were available during my care of the patient were reviewed by me and considered in my medical decision making (see MDM for details).  Medications Ordered in ED Medications  aspirin tablet 325 mg (325 mg Oral Given 11/25/21 1917)                                                                                                                                     Procedures Procedures  (including critical care time)  Medical Decision Making / ED Course   MDM:  76 year old female presenting to the emergency department with chest pain.  Patient well-appearing, physical exam reassuring.  No acute abnormality.  Low concern for ACS, patient had delta troponin x2 which was negative.  EKG without acute ST or T wave changes concerning for ischemia.  Low concern for PE, patient had negative D-dimer, no risk factors or physical exam findings concerning for PE.  x-ray without evidence of pneumothorax or pneumonia.  Doubt esophageal pathology without nausea or vomiting.  Given risk factors, discussed admission versus close cardiology follow-up for further work-up of patient's chest pain.  Patient prefers to be discharged with close cardiology follow-up.  Currently does not have a cardiologist so placed referral to cardiology for expedited referral.  Patient also requested refill of Coreg which I have refilled.  Given risk factors, resolved chest pain, will discharge patient to home. All questions answered. Patient comfortable with plan of discharge. Return precautions discussed with patient and specified on the after visit summary.  Advised strict return precautions given risk factors if she has any recurrent symptoms.       Additional history obtained: -Additional history obtained from family -External records from outside source obtained and reviewed including: Chart review including previous  notes, labs, imaging, consultation notes   Lab Tests: -I ordered, reviewed, and interpreted labs.   The pertinent results include:   Labs Reviewed  CBC - Abnormal; Notable for the following components:      Result Value   Hemoglobin 15.2 (*)    All other components within normal limits  BASIC METABOLIC PANEL - Abnormal; Notable for the following components:   Glucose, Bld 145 (*)    All other components within normal limits  BRAIN NATRIURETIC PEPTIDE - Abnormal; Notable for the following components:   B Natriuretic Peptide 150.9 (*)    All other components within normal limits  PROTIME-INR  D-DIMER, QUANTITATIVE  TROPONIN I (HIGH SENSITIVITY)  TROPONIN I (HIGH SENSITIVITY)  EKG   Sinus rhythm with occasional Premature ventricular complexes. No acute ST or T wave changes.    Imaging Studies ordered: I ordered imaging studies including chest x-ray I independently visualized and interpreted imaging. I agree with the radiologist interpretation   Medicines ordered and prescription drug management: Meds ordered this encounter  Medications   aspirin tablet 325 mg   carvedilol (COREG) 3.125 MG tablet    Sig: Take 1 tablet (3.125 mg total) by mouth 2 (two) times daily.    Dispense:  90 tablet    Refill:  0    -I have reviewed the patients home medicines and have made adjustments as needed     Cardiac Monitoring: The patient was maintained on a cardiac monitor.  I personally viewed and interpreted the cardiac monitored which showed an underlying rhythm of: NSR   Reevaluation: After the interventions noted above, I reevaluated the patient and found that they have :improved  Co morbidities that complicate the patient evaluation  Past Medical History:  Diagnosis Date   Diabetes mellitus without complication (HCC)    Hyperlipidemia    Hypertension       Dispostion: Discharge     Final Clinical Impression(s) / ED Diagnoses Final diagnoses:  Chest pain,  unspecified type     This chart was dictated using voice recognition software.  Despite best efforts to proofread,  errors can occur which can change the documentation meaning.    Lonell Grandchild, MD 11/26/21 [phone]

## 2022-02-06 ENCOUNTER — Encounter (INDEPENDENT_AMBULATORY_CARE_PROVIDER_SITE_OTHER): Payer: Self-pay

## 2022-06-08 ENCOUNTER — Encounter: Payer: Self-pay | Admitting: Radiology

## 2022-07-28 IMAGING — CT CT MAXILLOFACIAL W/O CM
3 series · 15 of 47 positions shown, 18 images · non-contrast
Comparison: MR brain 10/04/2020.

CLINICAL DATA: Orbital head trauma.

EXAM:
CT HEAD WITHOUT CONTRAST
CT MAXILLOFACIAL WITHOUT CONTRAST
TECHNIQUE: Multidetector CT imaging of the head and maxillofacial structures
were performed using the standard protocol without intravenous
contrast. Multiplanar CT image reconstructions of the maxillofacial
structures were also generated.
RADIATION DOSE REDUCTION: This exam was performed according to the
departmental dose-optimization program which includes automated
exposure control, adjustment of the mA and/or kV according to
patient size and/or use of iterative reconstruction technique.

[Series 2: max soft · axial · 0.33mm/px · z∈[-66,+70]mm · 9 of 80 slices shown, 12 images]
[im 6/80  brain]
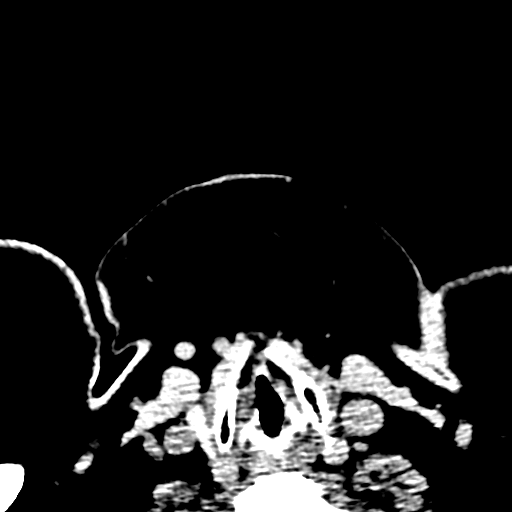
[im 6/80  bone]
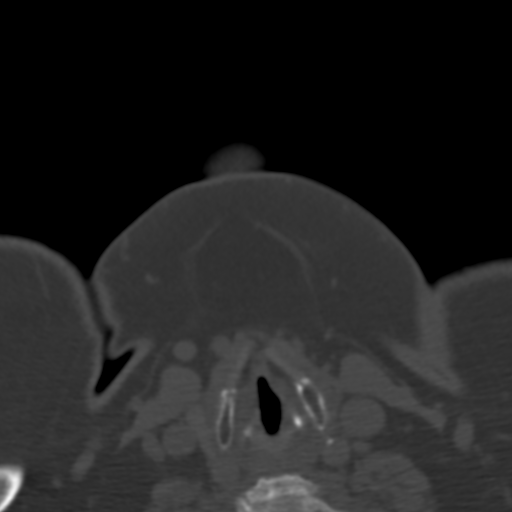
[im 14/80  bone]
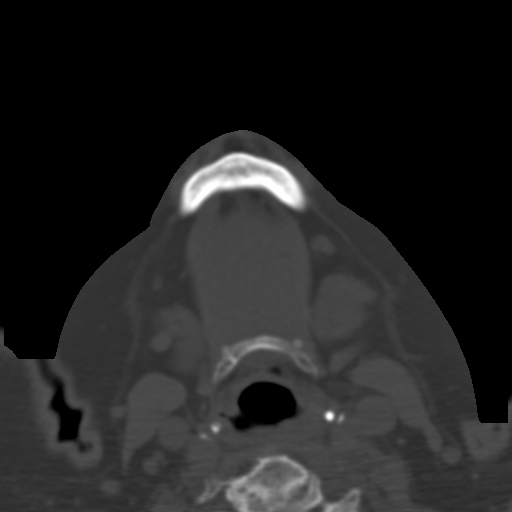
[im 22/80  bone]
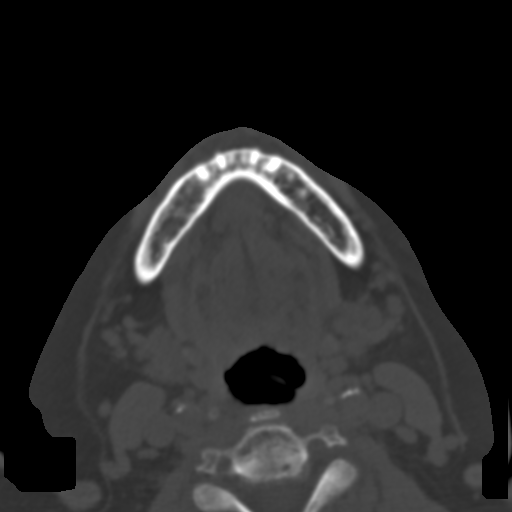
[im 30/80  bone]
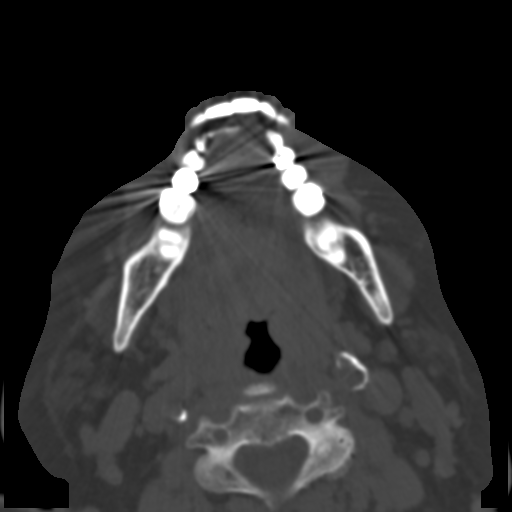
[im 41/80  brain]
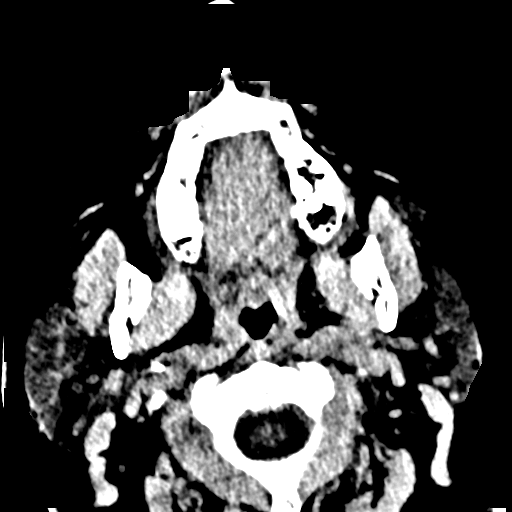
[im 41/80  bone]
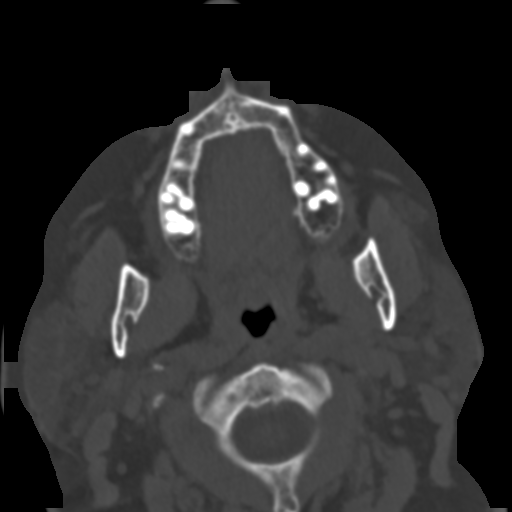
[im 50/80  bone]
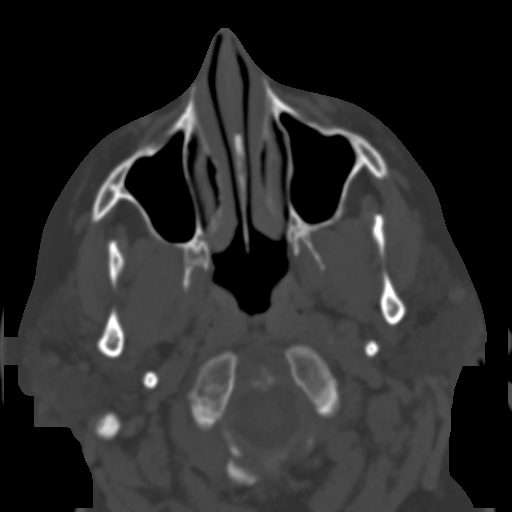
[im 58/80  bone]
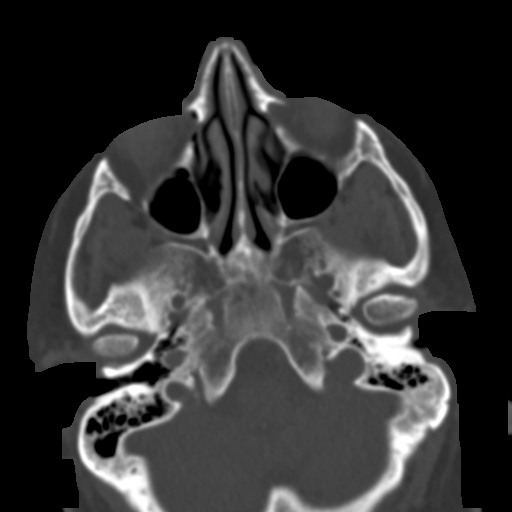
[im 66/80  bone]
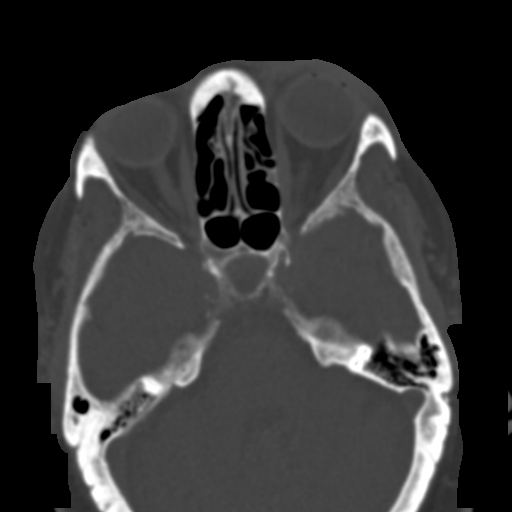
[im 74/80  brain]
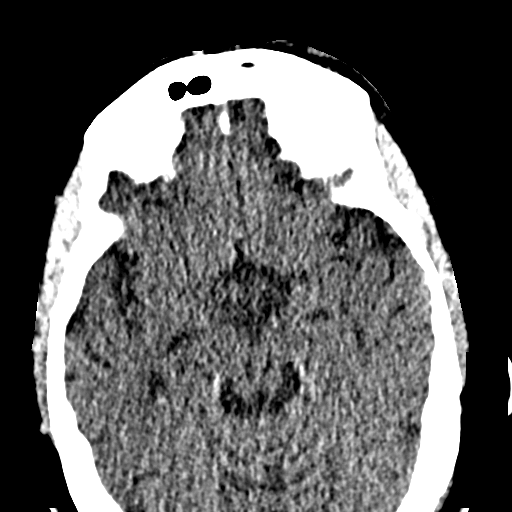
[im 74/80  bone]
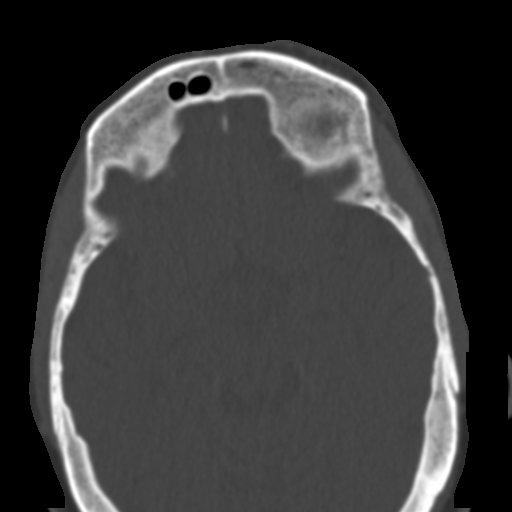

[Series 6: coronal soft · coronal · 0.36mm/px · 3 of 71 slices shown]
[im 24/71  bone]
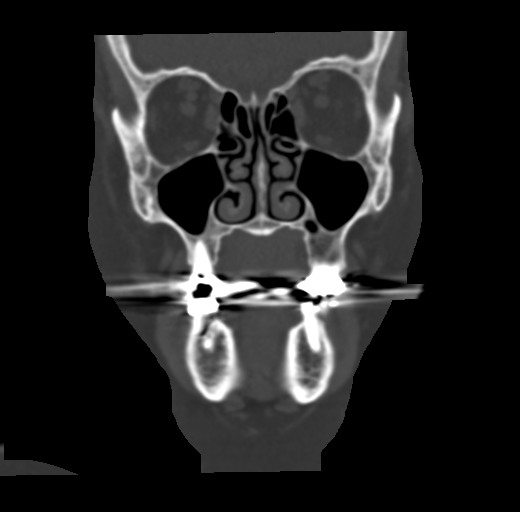
[im 32/71  bone]
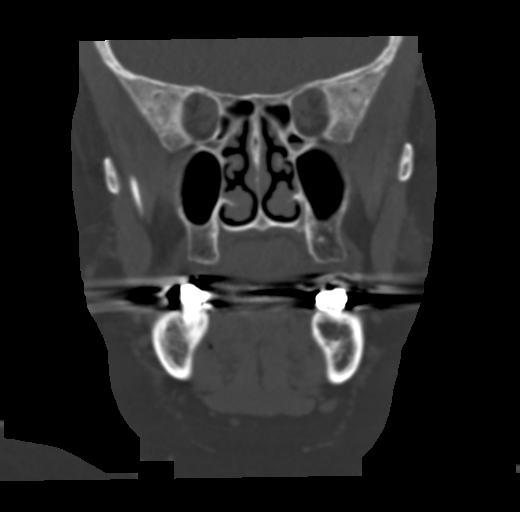
[im 39/71  bone]
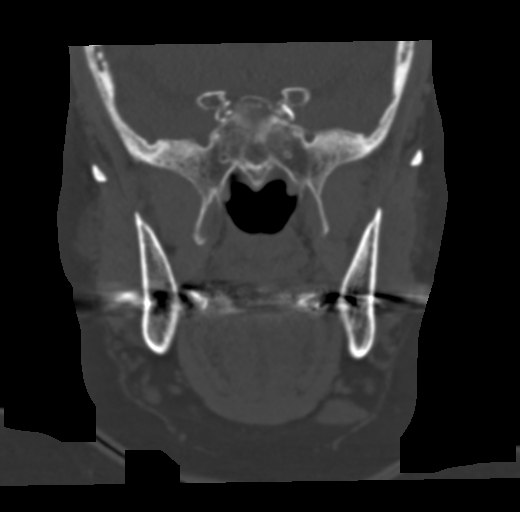

[Series 7: sagittal soft · sagittal · 0.33mm/px · 3 of 86 slices shown]
[im 29/86  bone]
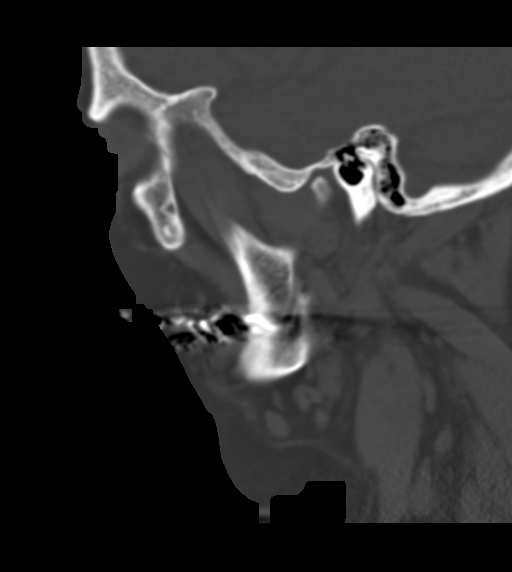
[im 43/86  bone]
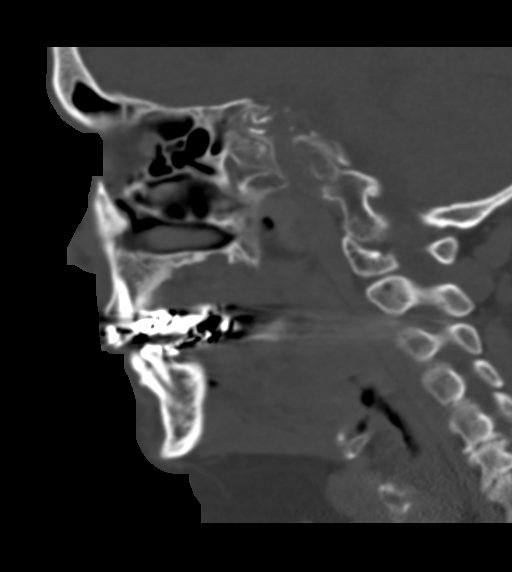
[im 57/86  bone]
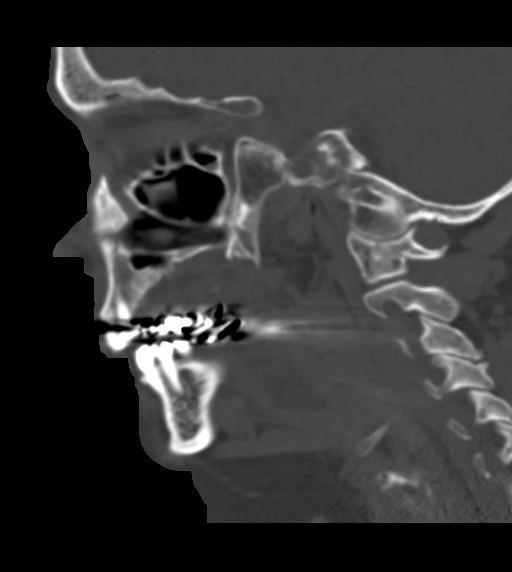

[15 of 47 positions shown; findings below may reference images not displayed]

No prior facial imaging for
comparison but the MRI does include most of the face.
FINDINGS: CT HEAD FINDINGS

Brain: Again noted are mild-to-moderate cerebral atrophy and
atrophic ventriculomegaly and mild small vessel disease of the
cerebral white matter. There is slight cerebellar atrophy. No
asymmetry is seen concerning for an acute infarct, hemorrhage or
mass. There is no old territorial infarct; there is no midline
shift.

Vascular: There are moderate patchy calcifications in the carotid
siphons but no hyperdense central vessels. Small calcification
distal left vertebral artery.

Skull: Normal. Negative for fracture or focal lesion. There is scalp
swelling in the left forehead continuing over the left sided
preorbital soft tissues.

Other: None.

CT MAXILLOFACIAL FINDINGS

Osseous: No fracture or mandibular dislocation. No destructive
process. Osteopenia.

Orbits: There is moderate swelling in the left preorbital soft
tissues but no orbital fracture , orbital hematoma or stranding. The
globes and optic nerves are symmetric. No extraocular muscle
thickening is seen.

Sinuses: Clear.

Soft tissues: As above there is scalp swelling in left forehead
continuing over the left orbit. Both palatine tonsils are prominent
for age, nearly abutting the uvula. Direct visualization is
recommended. No underlying fluid collection is seen.

There are bilateral high carotid bifurcations at the level of C2-3
with heavily calcified proximal cervical ICAs and likely
flow-limiting calcific stenoses bilaterally. Unremarkable parotid
and submandibular glands.
IMPRESSION: 1. No acute intracranial CT findings or depressed skull fractures.
2. Soft tissue swelling without evidence of orbital fractures. The
globes are symmetric and intact. Facial osteopenia.
3. Prominent palatine tonsils for a 75-year-old. Direct
visualization recommended.
4. Carotid atherosclerosis with high cervical carotid bifurcations
and heavily calcified proximal cervical ICAs. There are likely
flow-limiting stenoses of both proximal cervical ICAs. Clinical
correlation.

## 2022-07-28 IMAGING — CT CT HEAD W/O CM
3 of 4 series · 13 of 47 positions shown, 15 images · non-contrast
Comparison: MR brain 10/04/2020.

CLINICAL DATA: Orbital head trauma.

EXAM:
CT HEAD WITHOUT CONTRAST
CT MAXILLOFACIAL WITHOUT CONTRAST
TECHNIQUE: Multidetector CT imaging of the head and maxillofacial structures
were performed using the standard protocol without intravenous
contrast. Multiplanar CT image reconstructions of the maxillofacial
structures were also generated.
RADIATION DOSE REDUCTION: This exam was performed according to the
departmental dose-optimization program which includes automated
exposure control, adjustment of the mA and/or kV according to
patient size and/or use of iterative reconstruction technique.

[Series 2: head w o · axial · 0.42mm/px · z∈[+53,+163]mm · 7 of 30 slices shown, 9 images]
[im 4/30  brain]
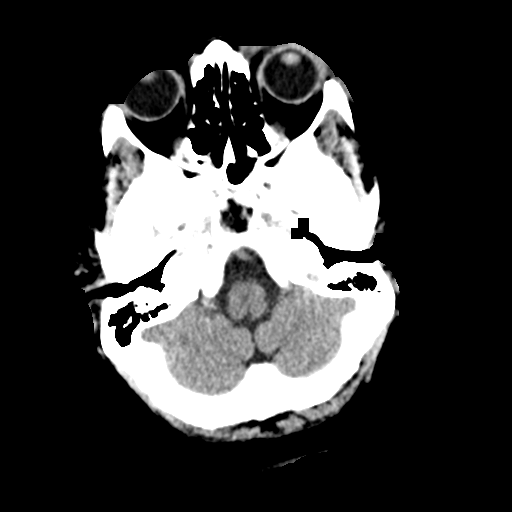
[im 4/30  bone]
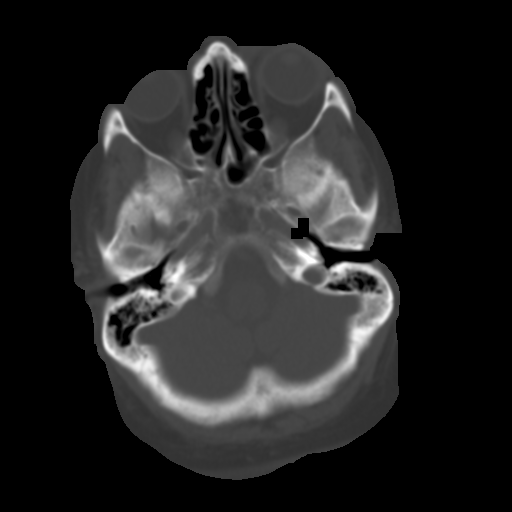
[im 8/30  brain]
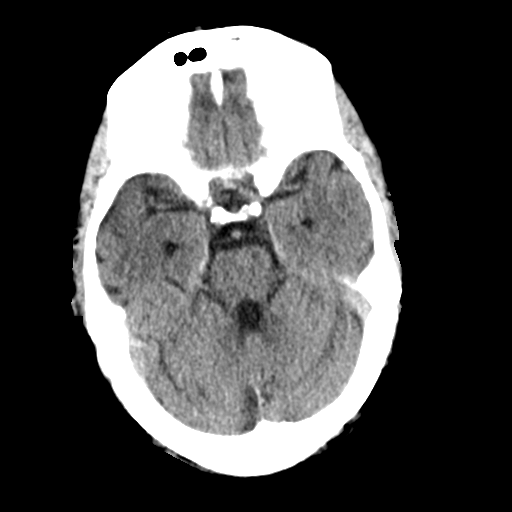
[im 11/30  brain]
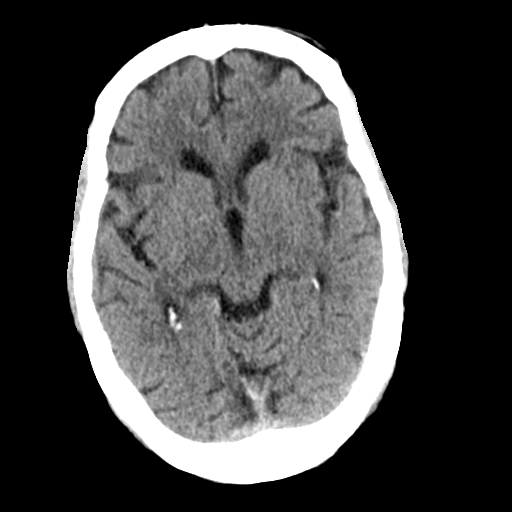
[im 15/30  brain]
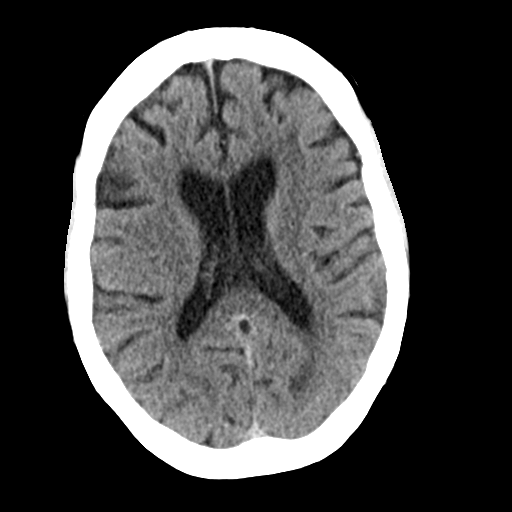
[im 19/30  brain]
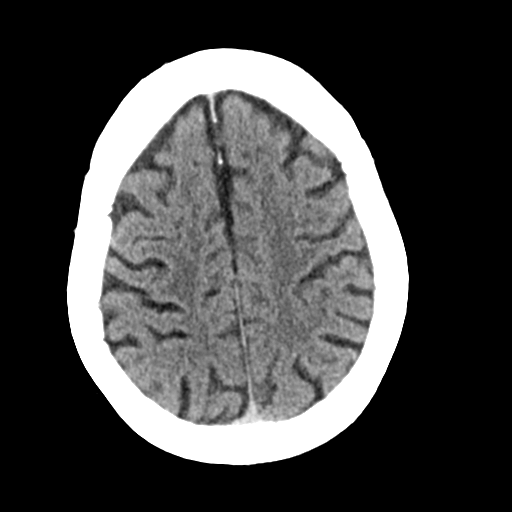
[im 19/30  bone]
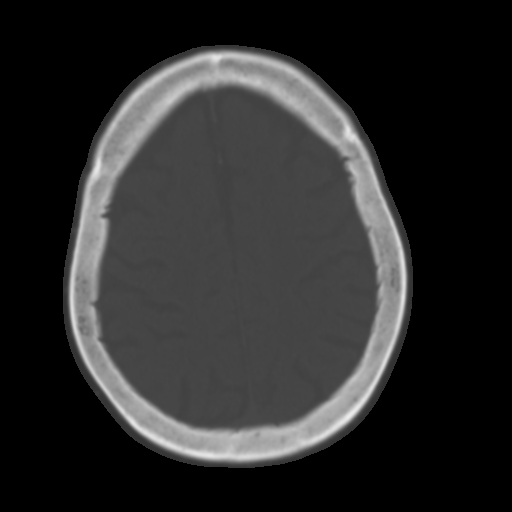
[im 22/30  brain]
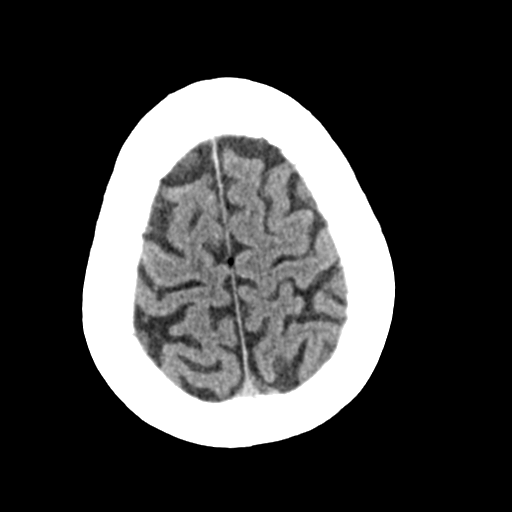
[im 26/30  brain]
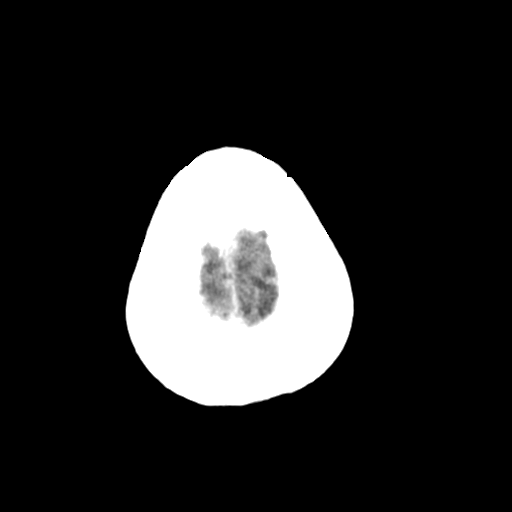

[Series 4: coronal soft · coronal · 0.29mm/px · 3 of 67 slices shown]
[im 23/67  brain]
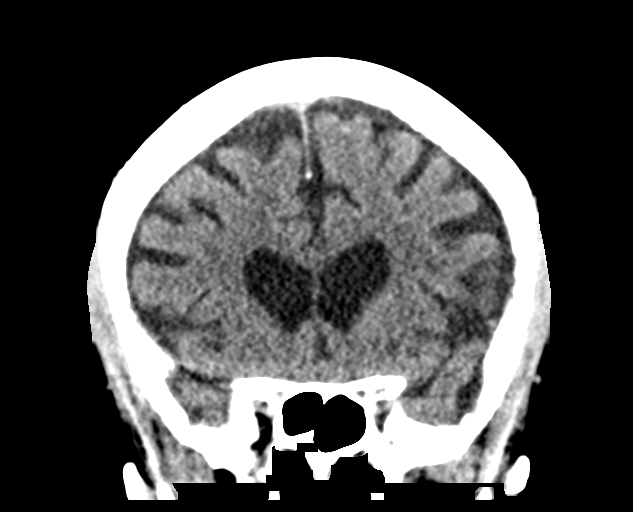
[im 30/67  brain]
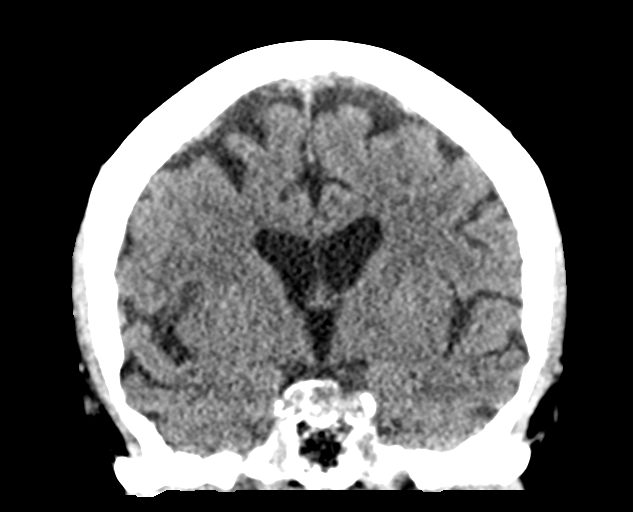
[im 37/67  brain]
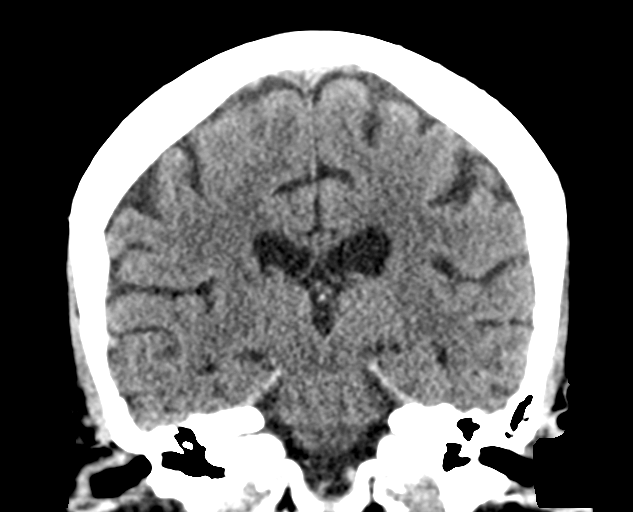

[Series 5: sagittal soft · sagittal · 0.29mm/px · 3 of 53 slices shown]
[im 18/53  brain]
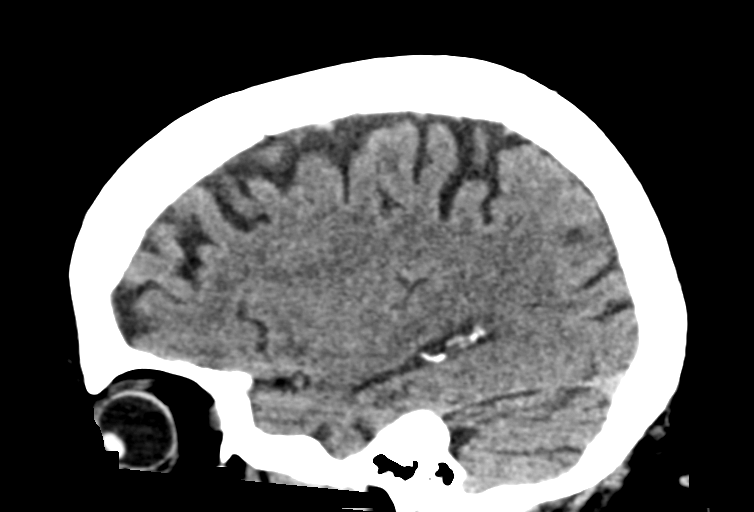
[im 27/53  brain]
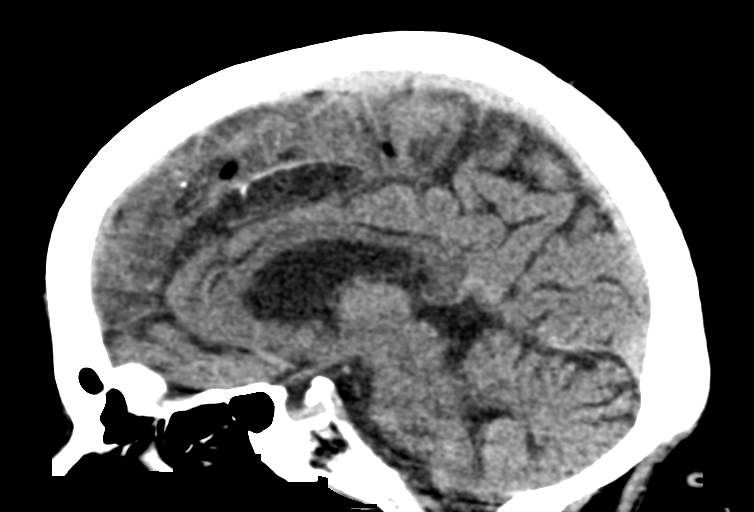
[im 35/53  brain]
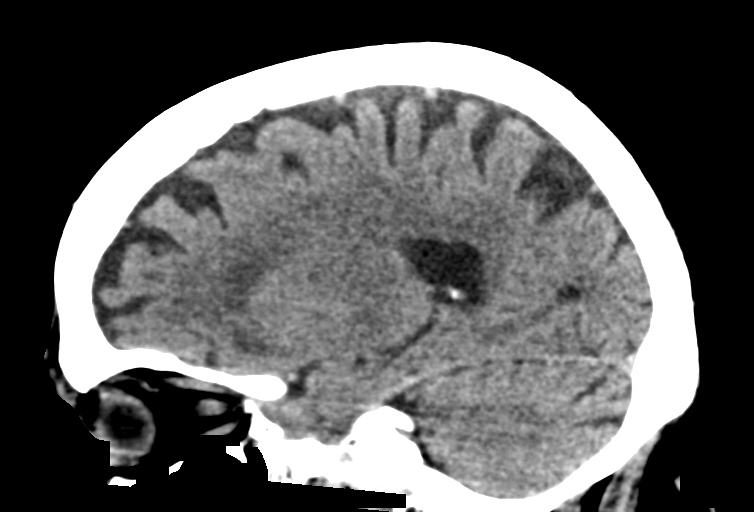

[13 of 47 positions shown; findings below may reference images not displayed]

No prior facial imaging for
comparison but the MRI does include most of the face.
FINDINGS: CT HEAD FINDINGS

Brain: Again noted are mild-to-moderate cerebral atrophy and
atrophic ventriculomegaly and mild small vessel disease of the
cerebral white matter. There is slight cerebellar atrophy. No
asymmetry is seen concerning for an acute infarct, hemorrhage or
mass. There is no old territorial infarct; there is no midline
shift.

Vascular: There are moderate patchy calcifications in the carotid
siphons but no hyperdense central vessels. Small calcification
distal left vertebral artery.

Skull: Normal. Negative for fracture or focal lesion. There is scalp
swelling in the left forehead continuing over the left sided
preorbital soft tissues.

Other: None.

CT MAXILLOFACIAL FINDINGS

Osseous: No fracture or mandibular dislocation. No destructive
process. Osteopenia.

Orbits: There is moderate swelling in the left preorbital soft
tissues but no orbital fracture , orbital hematoma or stranding. The
globes and optic nerves are symmetric. No extraocular muscle
thickening is seen.

Sinuses: Clear.

Soft tissues: As above there is scalp swelling in left forehead
continuing over the left orbit. Both palatine tonsils are prominent
for age, nearly abutting the uvula. Direct visualization is
recommended. No underlying fluid collection is seen.

There are bilateral high carotid bifurcations at the level of C2-3
with heavily calcified proximal cervical ICAs and likely
flow-limiting calcific stenoses bilaterally. Unremarkable parotid
and submandibular glands.
IMPRESSION: 1. No acute intracranial CT findings or depressed skull fractures.
2. Soft tissue swelling without evidence of orbital fractures. The
globes are symmetric and intact. Facial osteopenia.
3. Prominent palatine tonsils for a 75-year-old. Direct
visualization recommended.
4. Carotid atherosclerosis with high cervical carotid bifurcations
and heavily calcified proximal cervical ICAs. There are likely
flow-limiting stenoses of both proximal cervical ICAs. Clinical
correlation.

## 2022-12-25 ENCOUNTER — Other Ambulatory Visit (HOSPITAL_COMMUNITY): Payer: Self-pay | Admitting: Podiatry

## 2022-12-25 DIAGNOSIS — I739 Peripheral vascular disease, unspecified: Secondary | ICD-10-CM

## 2022-12-27 ENCOUNTER — Other Ambulatory Visit: Payer: Self-pay | Admitting: Family Medicine

## 2022-12-27 ENCOUNTER — Ambulatory Visit (HOSPITAL_COMMUNITY)
Admission: RE | Admit: 2022-12-27 | Discharge: 2022-12-27 | Disposition: A | Discharge status: Home / Self Care | Payer: Medicare Other | Source: Ambulatory Visit | Attending: Podiatry | Admitting: Podiatry

## 2022-12-27 ENCOUNTER — Other Ambulatory Visit (HOSPITAL_COMMUNITY): Payer: Self-pay | Admitting: Podiatry

## 2022-12-27 DIAGNOSIS — I739 Peripheral vascular disease, unspecified: Secondary | ICD-10-CM | POA: Diagnosis present

## 2022-12-27 DIAGNOSIS — Z1231 Encounter for screening mammogram for malignant neoplasm of breast: Secondary | ICD-10-CM

## 2023-01-03 ENCOUNTER — Ambulatory Visit
Admission: RE | Admit: 2023-01-03 | Discharge: 2023-01-03 | Disposition: A | Discharge status: Home / Self Care | Payer: Medicare Other | Source: Ambulatory Visit | Attending: Family Medicine | Admitting: Family Medicine

## 2023-01-03 DIAGNOSIS — Z1231 Encounter for screening mammogram for malignant neoplasm of breast: Secondary | ICD-10-CM | POA: Diagnosis present

## 2023-01-05 ENCOUNTER — Encounter: Payer: Medicare Other | Admitting: Vascular Surgery

## 2023-01-08 NOTE — Progress Notes (Unsigned)
[facility] [place]  ASSESSMENT / PLAN: Jessica Cuevas is a 77 y.o. 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  Patient counseled patients with asymptomatic peripheral arterial disease or claudication have a 1-2% risk of developing chronic limb threatening ischemia, but a 15-30% risk of mortality in the next 5 years. Intervention should only be considered for medically optimized patients with disabling symptoms.   Recommend:  Abstinence from all tobacco products. Blood glucose control with goal A1c < 7%. Blood pressure control with goal blood pressure < 140/90 mmHg. Lipid reduction therapy with goal LDL-C <100 mg/dL  Aspirin 81mg  PO QD.  Atorvastatin 40-80mg  PO QD (or other "high intensity" statin therapy). Daily walking to and past the point of discomfort. Patient counseled to keep a log of exercise distance.  Follow up with me in 1 year with repeat ABI and carotid duplex.  CHIEF COMPLAINT: cramping with walking.   HISTORY OF PRESENT ILLNESS: Jessica Cuevas is a 77 y.o. female referred to clinic for evaluation of peripheral arterial disease.  The patient reports fairly typical symptoms of intermittent claudication.  The patient can get to her mailbox before cramping discomfort starts.  She does not have similar discomfort when standing for long period of time.  She admits that she is not terribly active.  She does not have any ischemic rest pain symptoms in her feet.  She not have any ulcers of her feet.  She does notice a callus over her right plantar foot which on evaluation does not appear very problematic.  We reviewed her noninvasive testing in detail.  She did see Dr. Gilda Crease in the past for carotid artery stenosis.  He recommended a CT angiogram, which she did not follow-up.  Past Medical History:  Diagnosis Date   Diabetes mellitus without complication (HCC)    Hyperlipidemia     Hypertension     Past Surgical History:  Procedure Laterality Date   AORTIC VALVE REPLACEMENT     BREAST EXCISIONAL BIOPSY Left    benign excision    Family History  Problem Relation Age of Onset   Breast cancer Neg Hx     Social History   Socioeconomic History   Marital status: Divorced    Spouse name: Not on file   Number of children: Not on file   Years of education: Not on file   Highest education level: Not on file  Occupational History   Not on file  Tobacco Use   Smoking status: Never   Smokeless tobacco: Never  Vaping Use   Vaping status: Never Used  Substance and Sexual Activity   Alcohol use: No   Drug use: Never   Sexual activity: Not on file  Other Topics Concern   Not on file  Social History Narrative   Not on file   Social Determinants of Health   Financial Resource Strain: Not on file  Food Insecurity: Not on file  Transportation Needs: Not on file  Physical Activity: Not on file  Stress: Not on file  Social Connections: Not on file  Intimate Partner Violence: Not on file    Allergies  Allergen Reactions   Aloe Rash   Metformin And Related     Had lactic acidosis   Penicillins Rash   Tape Rash    Current Outpatient Medications  Medication Sig Dispense Refill   alendronate (FOSAMAX) 70 MG tablet Take 70 mg by mouth once a week.     aspirin  81 MG chewable tablet Chew 81 mg by mouth daily.     carvedilol (COREG) 3.125 MG tablet Take 1 tablet (3.125 mg total) by mouth 2 (two) times daily. 90 tablet 0   Cholecalciferol (VITAMIN D3) 5000 units TABS Take 5,000 Units by mouth daily.     Coenzyme Q-10 200 MG CAPS Take 1 capsule by mouth daily.     insulin NPH-regular Human (70-30) 100 UNIT/ML injection Inject 75-85 Units into the skin 2 (two) times daily with a meal. Use as directed per blood sugar levels.     lisinopril (ZESTRIL) 10 MG tablet Take 1 tablet by mouth daily.     Magnesium Oxide, Antacid, 500 MG CAPS Take 1 capsule by mouth  daily.     Melatonin 3 MG CAPS Take 6 mg by mouth at bedtime.     Multiple Vitamin (MULTIVITAMIN WITH MINERALS) TABS tablet Take 1 tablet by mouth daily.     Multiple Vitamins-Minerals (PRESERVISION AREDS 2+MULTI VIT PO) Take 1 capsule by mouth 2 (two) times daily.     Omega-3 Fatty Acids (FISH OIL) 1000 MG CAPS Take 1 capsule by mouth daily.     No current facility-administered medications for this visit.    PHYSICAL EXAM Vitals:   01/09/23 1001  BP: 134/78  Pulse: 77  Resp: 20  Temp: 97.9 F (36.6 C)  SpO2: 95%  Weight: 239 lb (108.4 kg)  Height: 5\' 1"  (1.549 m)    Obese elderly woman in no distress Regular rate and rhythm Unlabored breathing No palpable pedal pulses Significant varicosities across the lower extremities, right worse than left No ischemic ulceration about the feet bilaterally  PERTINENT LABORATORY AND RADIOLOGIC DATA  Most recent CBC    Latest Ref Rng & Units 11/25/2021    7:15 PM  CBC  WBC 4.0 - 10.5 K/uL 8.3   Hemoglobin 12.0 - 15.0 g/dL 16.1   Hematocrit 09.6 - 46.0 % 45.4   Platelets 150 - 400 K/uL 187      Most recent CMP    Latest Ref Rng & Units 11/25/2021    7:15 PM  CMP  Glucose 70 - 99 mg/dL 045   BUN 8 - 23 mg/dL 9   Creatinine 4.09 - 8.11 mg/dL 9.14   Sodium 782 - 956 mmol/L 138   Potassium 3.5 - 5.1 mmol/L 3.8   Chloride 98 - 111 mmol/L 104   CO2 22 - 32 mmol/L 23   Calcium 8.9 - 10.3 mg/dL 9.6    FINDINGS: Right Lower Extremity   Resting ABI:  0.5   Resting TBI: 0.29   Segmental Pressures: Limited utility given noncompressibility in the upper and lower thigh cuff. Great toe pressure: 54 mm Hg   Arterial Waveforms: Abnormal blunted monophasic arterial waveforms throughout.   PVRs: Diminished PVRs.   Left Lower Extremity:   Resting ABI: 0.79   Resting TBI: 0.40   Segmental Pressures: Limited evaluation given noncompressibility of the upper and lower thigh cuff. Great toe pressure: 76 mm Hg   Arterial  Waveforms: Abnormal monophasic and blunted arterial waveforms.   PVRs: Diminished PVRs.   Other: Symmetric upper extremity pressures.  Rande Brunt. Lenell Antu, MD FACS [facility] Phone Number: [phone] 01/09/2023 10:26 AM   Total time spent on preparing this encounter including chart review, data review, collecting history, examining the patient, coordinating care for this new patient, 60 minutes.  Portions of this report may have been transcribed using voice recognition software.  Every effort has been made to ensure accuracy; however, inadvertent computerized transcription errors may still be present.

## 2023-01-09 ENCOUNTER — Encounter: Payer: Self-pay | Admitting: Vascular Surgery

## 2023-01-09 ENCOUNTER — Ambulatory Visit (INDEPENDENT_AMBULATORY_CARE_PROVIDER_SITE_OTHER): Payer: Medicare Other | Admitting: Vascular Surgery

## 2023-01-09 VITALS — BP 134/78 | HR 77 | Temp 97.9°F | Resp 20 | Ht 61.0 in | Wt 239.0 lb

## 2023-01-09 DIAGNOSIS — I70213 Atherosclerosis of native arteries of extremities with intermittent claudication, bilateral legs: Secondary | ICD-10-CM

## 2023-01-19 ENCOUNTER — Encounter: Payer: Medicare Other | Admitting: Vascular Surgery

## 2023-01-26 ENCOUNTER — Other Ambulatory Visit: Payer: Self-pay

## 2023-01-26 DIAGNOSIS — I70213 Atherosclerosis of native arteries of extremities with intermittent claudication, bilateral legs: Secondary | ICD-10-CM

## 2023-01-26 DIAGNOSIS — I6523 Occlusion and stenosis of bilateral carotid arteries: Secondary | ICD-10-CM

## 2023-06-20 ENCOUNTER — Inpatient Hospital Stay (HOSPITAL_COMMUNITY)
Admission: EM | Admit: 2023-06-20 | Discharge: 2023-06-25 | DRG: 603 | Disposition: A | Discharge status: Skilled Nursing Facility | Attending: Internal Medicine | Admitting: Internal Medicine

## 2023-06-20 ENCOUNTER — Encounter (HOSPITAL_COMMUNITY): Payer: Self-pay | Admitting: Emergency Medicine

## 2023-06-20 ENCOUNTER — Emergency Department (HOSPITAL_COMMUNITY)

## 2023-06-20 ENCOUNTER — Other Ambulatory Visit: Payer: Self-pay

## 2023-06-20 DIAGNOSIS — E871 Hypo-osmolality and hyponatremia: Secondary | ICD-10-CM | POA: Diagnosis present

## 2023-06-20 DIAGNOSIS — Z7982 Long term (current) use of aspirin: Secondary | ICD-10-CM | POA: Diagnosis not present

## 2023-06-20 DIAGNOSIS — E119 Type 2 diabetes mellitus without complications: Secondary | ICD-10-CM

## 2023-06-20 DIAGNOSIS — L03119 Cellulitis of unspecified part of limb: Secondary | ICD-10-CM | POA: Diagnosis present

## 2023-06-20 DIAGNOSIS — L02611 Cutaneous abscess of right foot: Secondary | ICD-10-CM

## 2023-06-20 DIAGNOSIS — E66813 Obesity, class 3: Secondary | ICD-10-CM

## 2023-06-20 DIAGNOSIS — Z6841 Body Mass Index (BMI) 40.0 and over, adult: Secondary | ICD-10-CM | POA: Diagnosis not present

## 2023-06-20 DIAGNOSIS — Z0189 Encounter for other specified special examinations: Secondary | ICD-10-CM | POA: Diagnosis not present

## 2023-06-20 DIAGNOSIS — F0390 Unspecified dementia without behavioral disturbance: Secondary | ICD-10-CM | POA: Diagnosis present

## 2023-06-20 DIAGNOSIS — Z880 Allergy status to penicillin: Secondary | ICD-10-CM

## 2023-06-20 DIAGNOSIS — I739 Peripheral vascular disease, unspecified: Secondary | ICD-10-CM | POA: Diagnosis not present

## 2023-06-20 DIAGNOSIS — Z7983 Long term (current) use of bisphosphonates: Secondary | ICD-10-CM

## 2023-06-20 DIAGNOSIS — Z79899 Other long term (current) drug therapy: Secondary | ICD-10-CM | POA: Diagnosis not present

## 2023-06-20 DIAGNOSIS — Z952 Presence of prosthetic heart valve: Secondary | ICD-10-CM | POA: Diagnosis not present

## 2023-06-20 DIAGNOSIS — E785 Hyperlipidemia, unspecified: Secondary | ICD-10-CM | POA: Diagnosis present

## 2023-06-20 DIAGNOSIS — Z9109 Other allergy status, other than to drugs and biological substances: Secondary | ICD-10-CM

## 2023-06-20 DIAGNOSIS — I10 Essential (primary) hypertension: Secondary | ICD-10-CM | POA: Diagnosis present

## 2023-06-20 DIAGNOSIS — L03115 Cellulitis of right lower limb: Principal | ICD-10-CM | POA: Diagnosis present

## 2023-06-20 DIAGNOSIS — E1151 Type 2 diabetes mellitus with diabetic peripheral angiopathy without gangrene: Secondary | ICD-10-CM | POA: Diagnosis present

## 2023-06-20 DIAGNOSIS — Z794 Long term (current) use of insulin: Secondary | ICD-10-CM | POA: Diagnosis not present

## 2023-06-20 DIAGNOSIS — Z91048 Other nonmedicinal substance allergy status: Secondary | ICD-10-CM

## 2023-06-20 DIAGNOSIS — Z888 Allergy status to other drugs, medicaments and biological substances status: Secondary | ICD-10-CM

## 2023-06-20 LAB — CBC WITH DIFFERENTIAL/PLATELET
Abs Immature Granulocytes: 0.06 10*3/uL (ref 0.00–0.07)
Basophils Absolute: 0.1 10*3/uL (ref 0.0–0.1)
Basophils Relative: 1 %
Eosinophils Absolute: 0.1 10*3/uL (ref 0.0–0.5)
Eosinophils Relative: 1 %
HCT: 45.9 % (ref 36.0–46.0)
Hemoglobin: 15.3 g/dL — ABNORMAL HIGH (ref 12.0–15.0)
Immature Granulocytes: 1 %
Lymphocytes Relative: 21 %
Lymphs Abs: 1.8 10*3/uL (ref 0.7–4.0)
MCH: 31.7 pg (ref 26.0–34.0)
MCHC: 33.3 g/dL (ref 30.0–36.0)
MCV: 95.2 fL (ref 80.0–100.0)
Monocytes Absolute: 1 10*3/uL (ref 0.1–1.0)
Monocytes Relative: 12 %
Neutro Abs: 5.8 10*3/uL (ref 1.7–7.7)
Neutrophils Relative %: 64 %
Platelets: 229 10*3/uL (ref 150–400)
RBC: 4.82 MIL/uL (ref 3.87–5.11)
RDW: 13.1 % (ref 11.5–15.5)
WBC: 9 10*3/uL (ref 4.0–10.5)
nRBC: 0 % (ref 0.0–0.2)

## 2023-06-20 LAB — BASIC METABOLIC PANEL
Anion gap: 12 (ref 5–15)
BUN: 17 mg/dL (ref 8–23)
CO2: 20 mmol/L — ABNORMAL LOW (ref 22–32)
Calcium: 9.5 mg/dL (ref 8.9–10.3)
Chloride: 99 mmol/L (ref 98–111)
Creatinine, Ser: 0.59 mg/dL (ref 0.44–1.00)
GFR, Estimated: >60 mL/min (ref >60)
Glucose, Bld: 196 mg/dL — ABNORMAL HIGH (ref 70–99)
Potassium: 4.2 mmol/L (ref 3.5–5.1)
Sodium: 131 mmol/L — ABNORMAL LOW (ref 135–145)

## 2023-06-20 MED ORDER — LISINOPRIL 10 MG PO TABS
10.0000 mg | ORAL_TABLET | Freq: Every day | ORAL | Status: DC
Start: 2023-06-21 — End: 2023-06-21
  Administered 2023-06-21: 10 mg via ORAL
  Filled 2023-06-20: qty 1

## 2023-06-20 MED ORDER — CARVEDILOL 3.125 MG PO TABS
3.1250 mg | ORAL_TABLET | Freq: Two times a day (BID) | ORAL | Status: DC
Start: 2023-06-20 — End: 2023-06-23
  Administered 2023-06-20 – 2023-06-22 (×5): 3.125 mg via ORAL
  Filled 2023-06-20 (×5): qty 1

## 2023-06-20 MED ORDER — MAGNESIUM OXIDE (ANTACID) 500 MG PO CAPS
1.0000 | ORAL_CAPSULE | Freq: Every day | ORAL | Status: DC
Start: 2023-06-21 — End: 2023-06-20

## 2023-06-20 MED ORDER — ACETAMINOPHEN 325 MG PO TABS
650.0000 mg | ORAL_TABLET | Freq: Four times a day (QID) | ORAL | Status: DC | PRN
  Administered 2023-06-22 (×3): 650 mg via ORAL
  Filled 2023-06-20 (×3): qty 2

## 2023-06-20 MED ORDER — ADULT MULTIVITAMIN W/MINERALS CH
1.0000 | ORAL_TABLET | Freq: Every day | ORAL | Status: DC
  Administered 2023-06-21 – 2023-06-25 (×5): 1 via ORAL
  Filled 2023-06-20 (×5): qty 1

## 2023-06-20 MED ORDER — VANCOMYCIN HCL 2000 MG/400ML IV SOLN
2000.0000 mg | Freq: Once | INTRAVENOUS | Status: AC
  Administered 2023-06-21: 2000 mg via INTRAVENOUS
  Filled 2023-06-20: qty 400

## 2023-06-20 MED ORDER — SODIUM CHLORIDE 0.9 % IV SOLN
2.0000 g | Freq: Three times a day (TID) | INTRAVENOUS | Status: DC
  Administered 2023-06-21 – 2023-06-25 (×13): 2 g via INTRAVENOUS
  Filled 2023-06-20 (×13): qty 12.5

## 2023-06-20 MED ORDER — SODIUM CHLORIDE 0.9 % IV SOLN: INTRAVENOUS | Status: AC

## 2023-06-20 MED ORDER — VITAMIN D 25 MCG (1000 UNIT) PO TABS
5000.0000 [IU] | ORAL_TABLET | Freq: Every day | ORAL | Status: DC
Start: 2023-06-21 — End: 2023-06-25
  Administered 2023-06-21 – 2023-06-25 (×5): 5000 [IU] via ORAL
  Filled 2023-06-20 (×5): qty 5

## 2023-06-20 MED ORDER — MELATONIN 3 MG PO TABS
6.0000 mg | ORAL_TABLET | Freq: Every day | ORAL | Status: DC
  Administered 2023-06-20 – 2023-06-24 (×5): 6 mg via ORAL
  Filled 2023-06-20 (×6): qty 2

## 2023-06-20 MED ORDER — INSULIN ASPART PROT & ASPART (70-30 MIX) 100 UNIT/ML ~~LOC~~ SUSP
75.0000 [IU] | Freq: Two times a day (BID) | SUBCUTANEOUS | Status: DC
  Administered 2023-06-21: 75 [IU] via SUBCUTANEOUS
  Filled 2023-06-20: qty 10

## 2023-06-20 MED ORDER — ASPIRIN 81 MG PO CHEW
81.0000 mg | CHEWABLE_TABLET | Freq: Every day | ORAL | Status: DC
  Administered 2023-06-21 – 2023-06-25 (×5): 81 mg via ORAL
  Filled 2023-06-20 (×5): qty 1

## 2023-06-20 MED ORDER — COENZYME Q-10 200 MG PO CAPS: 1.0000 | ORAL_CAPSULE | Freq: Every day | ORAL | Status: DC

## 2023-06-20 MED ORDER — MAGNESIUM OXIDE -MG SUPPLEMENT 400 (240 MG) MG PO TABS
400.0000 mg | ORAL_TABLET | Freq: Every day | ORAL | Status: DC
  Administered 2023-06-21 – 2023-06-25 (×5): 400 mg via ORAL
  Filled 2023-06-20 (×5): qty 1

## 2023-06-20 MED ORDER — ENOXAPARIN SODIUM 40 MG/0.4ML IJ SOSY
40.0000 mg | PREFILLED_SYRINGE | INTRAMUSCULAR | Status: DC
  Administered 2023-06-21 – 2023-06-25 (×5): 40 mg via SUBCUTANEOUS
  Filled 2023-06-20 (×5): qty 0.4

## 2023-06-20 MED ORDER — SODIUM CHLORIDE 0.9 % IV SOLN
2.0000 g | Freq: Once | INTRAVENOUS | Status: AC
  Administered 2023-06-20: 2 g via INTRAVENOUS
  Filled 2023-06-20: qty 12.5

## 2023-06-20 MED ORDER — OMEGA-3-ACID ETHYL ESTERS 1 G PO CAPS
1.0000 g | ORAL_CAPSULE | Freq: Every day | ORAL | Status: DC
  Administered 2023-06-21 – 2023-06-25 (×5): 1 g via ORAL
  Filled 2023-06-20 (×5): qty 1

## 2023-06-20 MED ORDER — MAGNESIUM HYDROXIDE 400 MG/5ML PO SUSP: 30.0000 mL | Freq: Every day | ORAL | Status: DC | PRN

## 2023-06-20 MED ORDER — ONDANSETRON HCL 4 MG PO TABS: 4.0000 mg | ORAL_TABLET | Freq: Four times a day (QID) | ORAL | Status: DC | PRN

## 2023-06-20 MED ORDER — ALENDRONATE SODIUM 70 MG PO TABS: 70.0000 mg | ORAL_TABLET | ORAL | Status: DC

## 2023-06-20 MED ORDER — TRAZODONE HCL 50 MG PO TABS: 25.0000 mg | ORAL_TABLET | Freq: Every evening | ORAL | Status: DC | PRN

## 2023-06-20 MED ORDER — ONDANSETRON HCL 4 MG/2ML IJ SOLN: 4.0000 mg | Freq: Four times a day (QID) | INTRAMUSCULAR | Status: DC | PRN

## 2023-06-20 MED ORDER — OCUVITE-LUTEIN PO CAPS
1.0000 | ORAL_CAPSULE | Freq: Two times a day (BID) | ORAL | Status: DC
  Administered 2023-06-20 – 2023-06-25 (×10): 1 via ORAL
  Filled 2023-06-20 (×10): qty 1

## 2023-06-20 MED ORDER — VANCOMYCIN HCL IN DEXTROSE 1-5 GM/200ML-% IV SOLN
1000.0000 mg | Freq: Once | INTRAVENOUS | Status: DC
Start: 2023-06-20 — End: 2023-06-20

## 2023-06-20 MED ORDER — ACETAMINOPHEN 650 MG RE SUPP: 650.0000 mg | Freq: Four times a day (QID) | RECTAL | Status: DC | PRN

## 2023-06-20 NOTE — ED Provider Triage Note (Signed)
 Emergency Medicine Provider Triage Evaluation Note  Jessica Cuevas , a 78 y.o. female  was evaluated in triage.  Pt complains of pain, swelling and redness of her right distal foot.  She had a callas shaved from her foot 2 months ago.  Jessica Cuevas was healing, but noticed increasing pain and swelling to toes and dorsal foot.  Was started on Batriom by her PCP 5 days ago and her symptoms are not improving.  States her blood sugars have also been elevated.  PCP recommended her to come to ER for evaluation.  She denies fever.   Review of Systems  Positive: Redness, pain and swelling to right foot Negative: Fever, vomiting  Physical Exam  BP (!) 141/107   Pulse 83   Temp 98.7 F (37.1 C) (Oral)   Resp 16   Ht 5\' 1"  (1.549 m)   Wt 99.8 kg   SpO2 94%   BMI 41.57 kg/m  Gen:   Awake, no distress   Resp:  Normal effort  MSK:   Moves extremities without difficulty  Other:  Redness, swelling right distal foot  Medical Decision Making  Medically screening exam initiated at 5:03 PM.  Appropriate orders placed.  Jessica Cuevas was informed that the remainder of the evaluation will be completed by another provider, this initial triage assessment does not replace that evaluation, and the importance of remaining in the ED until their evaluation is complete.     Jessica Aus, PA-C 06/20/23 1707

## 2023-06-20 NOTE — H&P (Signed)
 [place]   PATIENT NAME: Jessica Cuevas    MR#:  [id number]  DATE OF BIRTH:  [birth year]  DATE OF ADMISSION:  06/20/2023  PRIMARY CARE PHYSICIAN: Jerl Mina, MD   Patient is coming from: Home  REQUESTING/REFERRING PHYSICIAN: Maxwell Marion, PA-C  CHIEF COMPLAINT:   Chief Complaint  Patient presents with   Cellulitis    HISTORY OF PRESENT ILLNESS:  Jessica Cuevas is a 78 y.o. female with medical history significant for type 2 diabetes mellitus, hypertension, dyslipidemia and dementia, who presented to the emergency room with acute onset of worsening right foot swelling that started on its dorsum and was noted to have significant worsening today.  She had any fever or chills.  No nausea or vomiting or abdominal pain.  No chest pain or palpitations.  No cough or wheezing hemoptysis.  He stated that before his cellulitis started he had a podiatry appointment a couple months ago for ingrown toenail and apparently had a scalpel resection of a callus by her podiatrist.  The patient's blood glucose has been dropping lately.  She has been cutting down her long-acting insulin from 75 to 35 units.  Her ED Course: When the patient came to the ER, vital signs were within normal and later BP was 154/91 with otherwise normal vital signs.  Labs revealed mild hyponatremia 131 and a CO2 of 20 with blood glucose of 196.  CBC showed hemoglobin 15.3 hematocrit 45.9.  Blood glucose was 196. EKG as reviewed by me : 1. Moderate to severe right lower extremity peripheral arterial disease. 2. Moderate left lower extremity peripheral arterial disease. 3. Findings suggest inflow (aortoiliac) occlusive disease. 4. Limited evaluation of the segmental evaluation in the lower extremities due to noncompressibility in the upper and lower thigh regions. This may be due to patient body habitus. Imaging: Two-view chest x-ray showed generalized soft tissues edema with no radiographic findings of  osteomyelitis or foreign body or soft tissue gas.  The patient will be admitted to a medical-surgical bed for further evaluation and management. PAST MEDICAL HISTORY:   Past Medical History:  Diagnosis Date   Diabetes mellitus without complication (HCC)    Hyperlipidemia    Hypertension   -Dementia  PAST SURGICAL HISTORY:   Past Surgical History:  Procedure Laterality Date   AORTIC VALVE REPLACEMENT     BREAST EXCISIONAL BIOPSY Left    benign excision    SOCIAL HISTORY:   Social History   Tobacco Use   Smoking status: Never   Smokeless tobacco: Never  Substance Use Topics   Alcohol use: No    FAMILY HISTORY:   Family History  Problem Relation Age of Onset   Breast cancer Neg Hx     DRUG ALLERGIES:   Allergies  Allergen Reactions   Aloe Rash   Metformin And Related     Had lactic acidosis   Penicillins Rash   Tape Rash    REVIEW OF SYSTEMS:   ROS As per history of present illness. All pertinent systems were reviewed above. Constitutional, HEENT, cardiovascular, respiratory, GI, GU, musculoskeletal, neuro, psychiatric, endocrine, integumentary and hematologic systems were reviewed and are otherwise negative/unremarkable except for positive findings mentioned above in the HPI.   MEDICATIONS AT HOME:   Prior to Admission medications   Medication Sig Start Date End Date Taking? Authorizing Provider  alendronate (FOSAMAX) 70 MG tablet Take 70 mg by mouth once a week. 05/09/22   [provider]  aspirin 81 MG chewable tablet  Chew 81 mg by mouth daily.    [provider]  carvedilol (COREG) 3.125 MG tablet Take 1 tablet (3.125 mg total) by mouth 2 (two) times daily. 11/25/21   Lonell Grandchild, MD  Cholecalciferol (VITAMIN D3) 5000 units TABS Take 5,000 Units by mouth daily.    [provider]  Coenzyme Q-10 200 MG CAPS Take 1 capsule by mouth daily.    [provider]  insulin NPH-regular Human (70-30) 100 UNIT/ML  injection Inject 75-85 Units into the skin 2 (two) times daily with a meal. Use as directed per blood sugar levels.    [provider]  lisinopril (ZESTRIL) 10 MG tablet Take 1 tablet by mouth daily. 02/21/21   [provider]  Magnesium Oxide, Antacid, 500 MG CAPS Take 1 capsule by mouth daily.    [provider]  Melatonin 3 MG CAPS Take 6 mg by mouth at bedtime.    [provider]  Multiple Vitamin (MULTIVITAMIN WITH MINERALS) TABS tablet Take 1 tablet by mouth daily.    [provider]  Multiple Vitamins-Minerals (PRESERVISION AREDS 2+MULTI VIT PO) Take 1 capsule by mouth 2 (two) times daily.    [provider]  Omega-3 Fatty Acids (FISH OIL) 1000 MG CAPS Take 1 capsule by mouth daily.    [provider]      VITAL SIGNS:  Blood pressure (!) 154/91, pulse 91, temperature 97.8 F (36.6 C), temperature source Oral, resp. rate 18, height 5\' 1"  (1.549 m), weight 99.8 kg, SpO2 97%.  PHYSICAL EXAMINATION:  Physical Exam  GENERAL:  78 y.o.-year-old female patient lying in the bed with no acute distress.  EYES: Pupils equal, round, reactive to light and accommodation. No scleral icterus. Extraocular muscles intact.  HEENT: Head atraumatic, Caucasian female normocephalic. Oropharynx and nasopharynx clear.  NECK:  Supple, no jugular venous distention. No thyroid enlargement, no tenderness.  LUNGS: Normal breath sounds bilaterally, no wheezing, rales,rhonchi or crepitation. No use of accessory muscles of respiration.  CARDIOVASCULAR: Regular rate and rhythm, S1, S2 normal. No murmurs, rubs, or gallops.  ABDOMEN: Soft, nondistended, nontender. Bowel sounds present. No organomegaly or mass.  EXTREMITIES: No pedal edema, cyanosis, or clubbing.  NEUROLOGIC: Cranial nerves II through XII are intact. Muscle strength 5/5 in all extremities. Sensation intact. Gait not checked.  PSYCHIATRIC: The patient is alert and oriented x 3.  Normal affect  and good eye contact. SKIN: Right dorsal foot erythema, warmth, tenderness and induration extending to her toes. LABORATORY PANEL:   CBC Recent Labs  Lab 06/20/23 1726  WBC 9.0  HGB 15.3*  HCT 45.9  PLT 229   ------------------------------------------------------------------------------------------------------------------  Chemistries  Recent Labs  Lab 06/20/23 1726  NA 131*  K 4.2  CL 99  CO2 20*  GLUCOSE 196*  BUN 17  CREATININE 0.59  CALCIUM 9.5   ------------------------------------------------------------------------------------------------------------------  Cardiac Enzymes No results for input(s): "TROPONINI" in the last 168 hours. ------------------------------------------------------------------------------------------------------------------  RADIOLOGY:  DG Foot Complete Right Result Date: 06/20/2023 CLINICAL DATA:  Provided history: Trauma, cellulitis. EXAM: RIGHT FOOT COMPLETE - 3+ VIEW; RIGHT ANKLE - COMPLETE 3+ VIEW COMPARISON:  None Available. FINDINGS: Ankle: No fracture or dislocation. The ankle mortise is preserved. No erosions or bony destructive change. No convincing ankle joint effusion. Generalized soft tissue edema. No radiopaque foreign body or soft tissue gas vascular calcifications are seen. Foot: No acute fracture or dislocation. Flattening of the second metatarsal head. Mild midfoot degenerative dorsal spurring. No erosions or bony destructive change. Generalized soft  tissue edema. No radiopaque foreign body or soft tissue gas. IMPRESSION: 1. Generalized soft tissue edema. No radiopaque foreign body or soft tissue gas. No radiographic findings of osteomyelitis. 2. No acute fracture or dislocation of the right ankle or foot. 3. 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 Electronically Signed   By: Narda Rutherford M.D.   On: 06/20/2023 19:30   DG Ankle Complete Right Result Date: 06/20/2023 CLINICAL DATA:  Provided  history: Trauma, cellulitis. EXAM: RIGHT FOOT COMPLETE - 3+ VIEW; RIGHT ANKLE - COMPLETE 3+ VIEW COMPARISON:  None Available. FINDINGS: Ankle: No fracture or dislocation. The ankle mortise is preserved. No erosions or bony destructive change. No convincing ankle joint effusion. Generalized soft tissue edema. No radiopaque foreign body or soft tissue gas vascular calcifications are seen. Foot: No acute fracture or dislocation. Flattening of the second metatarsal head. Mild midfoot degenerative dorsal spurring. No erosions or bony destructive change. Generalized soft tissue edema. No radiopaque foreign body or soft tissue gas. IMPRESSION: 1. Generalized soft tissue edema. No radiopaque foreign body or soft tissue gas. No radiographic findings of osteomyelitis. 2. No acute fracture or dislocation of the right ankle or foot. 3. 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 Electronically Signed   By: Narda Rutherford M.D.   On: 06/20/2023 19:30      IMPRESSION AND PLAN:  Assessment and Plan:  1.  Right foot cellulitis: - The patient be admitted to a medical-surgical bed. - Will continue antibiotic therapy with IV cefepime and vancomycin. - MRI of the right foot is currently pending. - Podiatry consult can be obtained in a.m. - Pain management will be provided. - Warm compresses will be applied.  2.  Essential hypertension. - We will continue antihypertensive therapy.  3.  Type 2 diabetes mellitus.   -The patient will be placed on supplement coverage with NovoLog. -We will continue his basal coverage.  DVT prophylaxis: Lovenox. Advanced Care Planning:  Code Status: full code. Family Communication:  The plan of care was discussed in details with the patient (and family). I answered all questions. The patient agreed to proceed with the above mentioned plan. Further management will depend upon hospital course. Disposition Plan: Back to previous home  environment Consults called: none. All the records are reviewed and case discussed with ED provider.  Status is: Inpatient  At the time of the admission, it appears that the appropriate admission status for this patient is inpatient.  This is judged to be reasonable and necessary in order to provide the required intensity of service to ensure the patient's safety given the presenting symptoms, physical exam findings and initial radiographic and laboratory data in the context of comorbid conditions.  The patient requires inpatient status due to high intensity of service, high risk of further deterioration and high frequency of surveillance required.  I certify that at the time of admission, it is my clinical judgment that the patient will require inpatient hospital care extending more than 2 midnights.                            Dispo: The patient is from: Home              Anticipated d/c is to: Home              Patient currently is not medically stable to d/c.  Difficult to place patient: No  Hannah Beat M.D on 06/21/2023 at 2:27 AM  [facility]   From 7 PM-7 AM, contact night-coverage [email or web address]  CC: Primary care physician; Jerl Mina, MD

## 2023-06-20 NOTE — ED Triage Notes (Signed)
 Pt reports she went to a foot dr to have her toe nails cut. The Dr shaved off a callus and left a "hard spot." Pt [place]. Pt was diagnosed with cellulitis and has redness, swelling, and pain in the right foot. She reports she is not improving with antibiotics. The pain also has caused a recent fall and has a sore ankle. Pt has can put minimal weight on her right foot.

## 2023-06-20 NOTE — Progress Notes (Addendum)
 Pharmacy Antibiotic Note  Jessica Cuevas is a 78 y.o. female admitted on 06/20/2023 with cellulitis.  Pharmacy has been consulted for cefepime/vancomycin dosing. Patient started on Bactrim 3/7 with no improvement.  -Cefepime/Vanc x1 in ED -WBC WNL, afebrile -No radiographic findings of osteomyelitis, MRI ordered  Plan: -Cefepime 2g IV every 8 hours -Vancomycin 2g IV x1 -Vancomycin 100mg  IV every 24 hours (AUC 492, Vd 0.5, IBW) -Monitor renal function -Follow up signs of clinical improvement, LOT, de-escalation of antibiotics   Height: 5\' 1"  (154.9 cm) Weight: 99.8 kg (220 lb) IBW/kg (Calculated) : 47.8  Temp (24hrs), Avg:99.2 F (37.3 C), Min:98.7 F (37.1 C), Max:99.7 F (37.6 C)  Recent Labs  Lab 06/20/23 1726  WBC 9.0  CREATININE 0.59    Estimated Creatinine Clearance: 62.8 mL/min (by C-G formula based on SCr of 0.59 mg/dL).    Allergies  Allergen Reactions   Aloe Rash   Metformin And Related     Had lactic acidosis   Penicillins Rash   Tape Rash    Antimicrobials this admission: Cefepime 3/12 >>  Vancomycin 3/12 >>   Microbiology results:  Thank you for allowing pharmacy to be a part of this patient's care.  Arabella Merles, PharmD. Clinical Pharmacist 06/20/2023 10:43 PM

## 2023-06-20 NOTE — ED Provider Notes (Cosign Needed Addendum)
 [place] EMERGENCY DEPARTMENT AT [facility] Provider Note   CSN: [id number] Arrival date & time: 06/20/23  1517     History  Chief Complaint  Patient presents with   Cellulitis    Jessica Cuevas is a 78 y.o. female with a history of type 2 diabetes mellitus and dementia who presents the ED today for cellulitis.  Patient reports pain and swelling to the right foot for the past 10 days.  She was seen by her endocrinologist on 3/7 started on Bactrim.  She has been taking it every day since, without improvement of cellulitis.  States that prior to that she went to her podiatrist and had her toenails cut.  Before that they shaved the callus off the bottom of her foot that has been causing her some discomfort since.  Reports increased pain to the foot since the onset of symptoms.  She states that she is barely able to put pressure on the foot second to pain.  At first the redness, pain, and swelling was at the top of her foot but now extends to the bottom of her foot as well.  Denies any fevers.  Was advised by her endocrinologist to come to the ED for further evaluation due to worsening of symptoms.    Home Medications Prior to Admission medications   Medication Sig Start Date End Date Taking? Authorizing Provider  alendronate (FOSAMAX) 70 MG tablet Take 70 mg by mouth once a week. 05/09/22   [provider]  aspirin 81 MG chewable tablet Chew 81 mg by mouth daily.    [provider]  carvedilol (COREG) 3.125 MG tablet Take 1 tablet (3.125 mg total) by mouth 2 (two) times daily. 11/25/21   Lonell Grandchild, MD  Cholecalciferol (VITAMIN D3) 5000 units TABS Take 5,000 Units by mouth daily.    [provider]  Coenzyme Q-10 200 MG CAPS Take 1 capsule by mouth daily.    [provider]  insulin NPH-regular Human (70-30) 100 UNIT/ML injection Inject 75-85 Units into the skin 2 (two) times daily with a meal. Use as directed per blood sugar levels.     [provider]  lisinopril (ZESTRIL) 10 MG tablet Take 1 tablet by mouth daily. 02/21/21   [provider]  Magnesium Oxide, Antacid, 500 MG CAPS Take 1 capsule by mouth daily.    [provider]  Melatonin 3 MG CAPS Take 6 mg by mouth at bedtime.    [provider]  Multiple Vitamin (MULTIVITAMIN WITH MINERALS) TABS tablet Take 1 tablet by mouth daily.    [provider]  Multiple Vitamins-Minerals (PRESERVISION AREDS 2+MULTI VIT PO) Take 1 capsule by mouth 2 (two) times daily.    [provider]  Omega-3 Fatty Acids (FISH OIL) 1000 MG CAPS Take 1 capsule by mouth daily.    [provider]      Allergies    Aloe, Metformin and related, Penicillins, and Tape    Review of Systems   Review of Systems  Skin:  Positive for color change.  All other systems reviewed and are negative.   Physical Exam Updated Vital Signs BP 134/85   Pulse 88   Temp 99.7 F (37.6 C) (Oral)   Resp 18   Ht 5\' 1"  (1.549 m)   Wt 99.8 kg   SpO2 94%   BMI 41.57 kg/m  Physical Exam Vitals and nursing note reviewed.  Constitutional:      General: She is not in  acute distress.    Appearance: Normal appearance.  HENT:     Head: Normocephalic and atraumatic.     Mouth/Throat:     Mouth: Mucous membranes are moist.  Eyes:     Conjunctiva/sclera: Conjunctivae normal.     Pupils: Pupils are equal, round, and reactive to light.  Cardiovascular:     Rate and Rhythm: Normal rate and regular rhythm.     Pulses: Normal pulses.     Heart sounds: Normal heart sounds.  Pulmonary:     Effort: Pulmonary effort is normal.     Breath sounds: Normal breath sounds.  Abdominal:     Palpations: Abdomen is soft.     Tenderness: There is no abdominal tenderness.  Musculoskeletal:        General: Swelling and tenderness present.     Cervical back: Normal range of motion.     Comments: Swelling, tenderness, and erythema to the dorsum and plantar aspect of  right foot at the distal metatarsals.  Skin:    General: Skin is warm and dry.     Findings: No rash.  Neurological:     General: No focal deficit present.     Mental Status: She is alert.     Sensory: No sensory deficit.     Motor: No weakness.  Psychiatric:        Mood and Affect: Mood normal.        Behavior: Behavior normal.    ED Results / Procedures / Treatments   Labs (all labs ordered are listed, but only abnormal results are displayed) Labs Reviewed  CBC WITH DIFFERENTIAL/PLATELET - Abnormal; Notable for the following components:      Result Value   Hemoglobin 15.3 (*)    All other components within normal limits  BASIC METABOLIC PANEL - Abnormal; Notable for the following components:   Sodium 131 (*)    CO2 20 (*)    Glucose, Bld 196 (*)    All other components within normal limits    EKG None  Radiology DG Foot Complete Right Result Date: 06/20/2023 CLINICAL DATA:  Provided history: Trauma, cellulitis. EXAM: RIGHT FOOT COMPLETE - 3+ VIEW; RIGHT ANKLE - COMPLETE 3+ VIEW COMPARISON:  None Available. FINDINGS: Ankle: No fracture or dislocation. The ankle mortise is preserved. No erosions or bony destructive change. No convincing ankle joint effusion. Generalized soft tissue edema. No radiopaque foreign body or soft tissue gas vascular calcifications are seen. Foot: No acute fracture or dislocation. Flattening of the second metatarsal head. Mild midfoot degenerative dorsal spurring. No erosions or bony destructive change. Generalized soft tissue edema. No radiopaque foreign body or soft tissue gas. IMPRESSION: 1. Generalized soft tissue edema. No radiopaque foreign body or soft tissue gas. No radiographic findings of osteomyelitis. 2. No acute fracture or dislocation of the right ankle or foot. 3. 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 Electronically Signed   By: Narda Rutherford M.D.   On: 06/20/2023 19:30   DG Ankle Complete  Right Result Date: 06/20/2023 CLINICAL DATA:  Provided history: Trauma, cellulitis. EXAM: RIGHT FOOT COMPLETE - 3+ VIEW; RIGHT ANKLE - COMPLETE 3+ VIEW COMPARISON:  None Available. FINDINGS: Ankle: No fracture or dislocation. The ankle mortise is preserved. No erosions or bony destructive change. No convincing ankle joint effusion. Generalized soft tissue edema. No radiopaque foreign body or soft tissue gas vascular calcifications are seen. Foot: No acute fracture or dislocation. Flattening of the second metatarsal head. Mild midfoot degenerative dorsal  spurring. No erosions or bony destructive change. Generalized soft tissue edema. No radiopaque foreign body or soft tissue gas. IMPRESSION: 1. Generalized soft tissue edema. No radiopaque foreign body or soft tissue gas. No radiographic findings of osteomyelitis. 2. No acute fracture or dislocation of the right ankle or foot. 3. 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 Electronically Signed   By: Narda Rutherford M.D.   On: 06/20/2023 19:30    Procedures Procedures: not indicated.   Medications Ordered in ED Medications - No data to display  ED Course/ Medical Decision Making/ A&P                                 Medical Decision Making Amount and/or Complexity of Data Reviewed Labs: ordered. Radiology: ordered.  Risk Decision regarding hospitalization.   This patient presents to the ED for concern of foot pain, this involves an extensive number of treatment options, and is a complaint that carries with it a high risk of complications and morbidity.   Differential diagnosis includes: Cellulitis, gangrene, osteomyelitis, fracture, dislocation, sprain, etc.   Comorbidities  See HPI above   Additional History  Additional history obtained from endocrinology note   Lab Tests  I ordered and personally interpreted labs.  The pertinent results include:   CBC does not show any elevation and  WBC Elevated glucose of 196 on BMP otherwise within normal limits   Imaging Studies  I ordered imaging studies including right foot and ankle x-rays, MRI right foot I independently visualized and interpreted imaging which showed:  Generalized soft tissue edema.  No radiopaque foreign body or soft tissue gas.  No radiographic findings of osteomyelitis. No acute fracture or dislocation of right ankle or foot. MRI pending at time of admission I agree with the radiologist interpretation   Consultations  I requested consultation with Dr. Arville Care with [facility],  and discussed lab and imaging findings as well as pertinent plan - they recommend: Admission for further workup and management of patient's symptoms.   Problem List / ED Course / Critical Interventions / Medication Management  Cellulitis of the right dorsal foot for the past 10 days.  She was evaluated by her endocrinologist on 3/7 and started on Bactrim.  She has been taking this medication for 5 days without improvement of symptoms.  She now has redness and swelling to the plantar aspect of her foot as well as her toes. I have reviewed the patients home medicines and have made adjustments as needed Staff with my attending, Dr. Hyacinth Meeker who also evaluated patient.  Advise getting MRI for further evaluation of osteomyelitis.   Social Determinants of Health  Access to healthcare   Test / Admission - Considered  Discussed findings with patient and family at bedside.  They are agreeable with plan for admission.       Final Clinical Impression(s) / ED Diagnoses Final diagnoses:  Cellulitis of foot    Rx / DC Orders ED Discharge Orders     None         Maxwell Marion, PA-C 06/20/23 2224    Maxwell Marion, PA-C 06/20/23 2251    Eber Hong, MD 06/21/23 1446

## 2023-06-21 ENCOUNTER — Inpatient Hospital Stay (HOSPITAL_COMMUNITY)

## 2023-06-21 DIAGNOSIS — I10 Essential (primary) hypertension: Secondary | ICD-10-CM | POA: Diagnosis not present

## 2023-06-21 DIAGNOSIS — Z794 Long term (current) use of insulin: Secondary | ICD-10-CM

## 2023-06-21 DIAGNOSIS — L02611 Cutaneous abscess of right foot: Secondary | ICD-10-CM | POA: Diagnosis not present

## 2023-06-21 DIAGNOSIS — L03115 Cellulitis of right lower limb: Secondary | ICD-10-CM | POA: Diagnosis not present

## 2023-06-21 LAB — GLUCOSE, CAPILLARY
Glucose-Capillary: 198 mg/dL — ABNORMAL HIGH (ref 70–99)
Glucose-Capillary: 238 mg/dL — ABNORMAL HIGH (ref 70–99)
Glucose-Capillary: 220 mg/dL — ABNORMAL HIGH (ref 70–99)
Glucose-Capillary: 203 mg/dL — ABNORMAL HIGH (ref 70–99)
Glucose-Capillary: 193 mg/dL — ABNORMAL HIGH (ref 70–99)
Glucose-Capillary: 129 mg/dL — ABNORMAL HIGH (ref 70–99)

## 2023-06-21 LAB — BASIC METABOLIC PANEL
Anion gap: 9 (ref 5–15)
BUN: 16 mg/dL (ref 8–23)
CO2: 20 mmol/L — ABNORMAL LOW (ref 22–32)
Calcium: 9 mg/dL (ref 8.9–10.3)
Chloride: 102 mmol/L (ref 98–111)
Creatinine, Ser: 0.47 mg/dL (ref 0.44–1.00)
GFR, Estimated: >60 mL/min (ref >60)
Glucose, Bld: 205 mg/dL — ABNORMAL HIGH (ref 70–99)
Potassium: 4.3 mmol/L (ref 3.5–5.1)
Sodium: 131 mmol/L — ABNORMAL LOW (ref 135–145)

## 2023-06-21 LAB — CBC
HCT: 42.6 % (ref 36.0–46.0)
Hemoglobin: 13.9 g/dL (ref 12.0–15.0)
MCH: 31.3 pg (ref 26.0–34.0)
MCHC: 32.6 g/dL (ref 30.0–36.0)
MCV: 95.9 fL (ref 80.0–100.0)
Platelets: 229 10*3/uL (ref 150–400)
RBC: 4.44 MIL/uL (ref 3.87–5.11)
RDW: 12.9 % (ref 11.5–15.5)
WBC: 9.2 10*3/uL (ref 4.0–10.5)
nRBC: 0 % (ref 0.0–0.2)

## 2023-06-21 LAB — HEMOGLOBIN A1C
Hgb A1c MFr Bld: 10 % — ABNORMAL HIGH (ref 4.8–5.6)
Mean Plasma Glucose: 240.3 mg/dL

## 2023-06-21 MED ORDER — INSULIN ASPART PROT & ASPART (70-30 MIX) 100 UNIT/ML ~~LOC~~ SUSP: 15.0000 [IU] | Freq: Once | SUBCUTANEOUS | Status: DC

## 2023-06-21 MED ORDER — INSULIN ASPART 100 UNIT/ML IJ SOLN
0.0000 [IU] | Freq: Three times a day (TID) | INTRAMUSCULAR | Status: DC
  Administered 2023-06-21: 3 [IU] via SUBCUTANEOUS
  Administered 2023-06-21 (×2): 5 [IU] via SUBCUTANEOUS
  Administered 2023-06-22 (×2): 3 [IU] via SUBCUTANEOUS
  Administered 2023-06-22 – 2023-06-23 (×3): 5 [IU] via SUBCUTANEOUS
  Administered 2023-06-23: 2 [IU] via SUBCUTANEOUS
  Administered 2023-06-24 (×2): 11 [IU] via SUBCUTANEOUS

## 2023-06-21 MED ORDER — GADOBUTROL 1 MMOL/ML IV SOLN
10.0000 mL | Freq: Once | INTRAVENOUS | Status: AC | PRN
  Administered 2023-06-21: 10 mL via INTRAVENOUS

## 2023-06-21 MED ORDER — AMLODIPINE BESYLATE 5 MG PO TABS
10.0000 mg | ORAL_TABLET | Freq: Every day | ORAL | Status: DC
  Administered 2023-06-22 – 2023-06-25 (×4): 10 mg via ORAL
  Filled 2023-06-21 (×4): qty 2

## 2023-06-21 MED ORDER — VANCOMYCIN HCL IN DEXTROSE 1-5 GM/200ML-% IV SOLN
1000.0000 mg | INTRAVENOUS | Status: DC
  Administered 2023-06-22 – 2023-06-24 (×4): 1000 mg via INTRAVENOUS
  Filled 2023-06-21 (×4): qty 200

## 2023-06-21 MED ORDER — OXYCODONE HCL 5 MG PO TABS: 10.0000 mg | ORAL_TABLET | ORAL | Status: DC | PRN

## 2023-06-21 MED ORDER — OXYCODONE HCL 5 MG PO TABS: 5.0000 mg | ORAL_TABLET | ORAL | Status: DC | PRN

## 2023-06-21 MED ORDER — INSULIN ASPART PROT & ASPART (70-30 MIX) 100 UNIT/ML ~~LOC~~ SUSP
30.0000 [IU] | Freq: Every day | SUBCUTANEOUS | Status: DC
  Administered 2023-06-21 – 2023-06-24 (×4): 30 [IU] via SUBCUTANEOUS
  Filled 2023-06-21: qty 10

## 2023-06-21 MED ORDER — INSULIN ASPART 100 UNIT/ML IJ SOLN
0.0000 [IU] | Freq: Every day | INTRAMUSCULAR | Status: DC
  Administered 2023-06-21 – 2023-06-23 (×2): 2 [IU] via SUBCUTANEOUS
  Administered 2023-06-24: 3 [IU] via SUBCUTANEOUS

## 2023-06-21 MED ORDER — INSULIN ASPART PROT & ASPART (70-30 MIX) 100 UNIT/ML ~~LOC~~ SUSP
40.0000 [IU] | Freq: Every day | SUBCUTANEOUS | Status: DC
  Administered 2023-06-22 – 2023-06-25 (×4): 40 [IU] via SUBCUTANEOUS
  Filled 2023-06-21 (×4): qty 10

## 2023-06-21 NOTE — Progress Notes (Addendum)
 PROGRESS NOTE    Jessica Cuevas  WUJ:[id number] DOB: 18-Mar-1946 DOA: 06/20/2023 PCP: Jerl Mina, MD  Subjective: Pt seen and examined. Pt states she had a callus shaved off the bottom of her foot by a podiatrist. Does not remember name of podiatrist.  MRI right foot shows right foot abscess. I contacted ortho at [facility]. Dr. Lajoyce Corners is out of town this week.   Hospital Course: HPI: Jessica Cuevas is a 78 y.o. female with medical history significant for type 2 diabetes mellitus, hypertension, dyslipidemia and dementia, who presented to the emergency room with acute onset of worsening right foot swelling that started on its dorsum and was noted to have significant worsening today.  She had any fever or chills.  No nausea or vomiting or abdominal pain.  No chest pain or palpitations.  No cough or wheezing hemoptysis.  He stated that before his cellulitis started he had a podiatry appointment a couple months ago for ingrown toenail and apparently had a scalpel resection of a callus by her podiatrist.  The patient's blood glucose has been dropping lately.  She has been cutting down her long-acting insulin from 75 to 35 units.   Significant Events: Admitted 06/20/2023 for right foot cellulitis   Significant Labs: WBC 9.0, HgB 15.3, plt 229 Na 131, K 4.2, CO2 of 20, BUN 17, Scr 0.59, glu 196  Significant Imaging Studies: Right foot/ankle XR Generalized soft tissue edema. No radiopaque foreign body or soft tissue gas. No radiographic findings of osteomyelitis. 2. No acute fracture or dislocation of the right ankle or foot. 3. 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  Antibiotic Therapy: Anti-infectives (From admission, onward)    Start     Dose/Rate Route Frequency Ordered Stop   06/21/23 0800  ceFEPIme (MAXIPIME) 2 g in sodium chloride 0.9 % 100 mL IVPB        2 g 200 mL/hr over 30 Minutes Intravenous Every 8 hours 06/20/23 2241     06/20/23 2245  vancomycin  (VANCOCIN) IVPB 1000 mg/200 mL premix  Status:  Discontinued        1,000 mg 200 mL/hr over 60 Minutes Intravenous  Once 06/20/23 2230 06/20/23 2241   06/20/23 2245  ceFEPIme (MAXIPIME) 2 g in sodium chloride 0.9 % 100 mL IVPB        2 g 200 mL/hr over 30 Minutes Intravenous  Once 06/20/23 2230 06/21/23 0016   06/20/23 2245  vancomycin (VANCOREADY) IVPB 2000 mg/400 mL        2,000 mg 200 mL/hr over 120 Minutes Intravenous  Once 06/20/23 2241 06/21/23 0309       Procedures:   Consultants:     Assessment and Plan: * Cellulitis of right foot On admission. - The patient be admitted to a medical-surgical bed. - Will continue antibiotic therapy with IV cefepime and vancomycin. - MRI of the right foot is currently pending. - Podiatry consult can be obtained in a.m. - Pain management will be provided. - Warm compresses will be applied.  06-21-2023 Pt states she had a callus shaved off the bottom of her foot by a podiatrist. Does not remember name of podiatrist. MRI right foot shows right foot abscess. I contacted ortho at [facility]. Dr. Lajoyce Corners is out of town this week. Will attempt to locate a podiatrist at [facility] if possible. Continue with IV ABX for now.      Abscess of right foot 06-21-2023 MRI shows right foot abscess. Pt states she had  a callus shaved off the bottom of her foot by a podiatrist. Does not remember name of podiatrist. MRI right foot shows right foot abscess. I contacted ortho at [facility]. Dr. Lajoyce Corners is out of town this week. Will attempt to locate a podiatrist at [facility] if possible. Continue with IV ABX for now.  Obesity, Class III, BMI 40-49.9 (morbid obesity) (HCC) Estimated body mass index is 41.57 kg/m as calculated from the following:   Height as of this encounter: 5\' 1"  (1.549 m).   Weight as of this encounter: 99.8 kg.   Essential hypertension On admission. continue antihypertensive therapy.  06-21-2023 hold ACEI while on IV Vanco. Change to po norvasc 10 mg daily for now. Continue  coreg 3.125 mg bid.  Type 2 diabetes mellitus without complication, with long-term current use of insulin (HCC) 06-21-2023 continue with SSI. And 70/30.   DVT prophylaxis: enoxaparin (LOVENOX) injection 40 mg Start: 06/21/23 1000    Code Status: Full Code Family Communication: no family at bedside Disposition Plan: unknown Reason for continuing need for hospitalization: remains on IV ABX  Objective: Vitals:   06/20/23 2235 06/20/23 2302 06/21/23 0316 06/21/23 0908  BP: 138/62 (!) 154/91 136/62 (!) 152/105  Pulse: 85 91 87 88  Resp:  18 18 16   Temp: 97.9 F (36.6 C) 97.8 F (36.6 C) 98.4 F (36.9 C) 98.3 F (36.8 C)  TempSrc:  Oral Oral Oral  SpO2: 94% 97% 95% 96%  Weight:      Height:        Intake/Output Summary (Last 24 hours) at 06/21/2023 1247 Last data filed at 06/21/2023 1225 Gross per 24 hour  Intake 1644.32 ml  Output 2 ml  Net 1642.32 ml   Filed Weights   06/20/23 1609  Weight: 99.8 kg    Examination:  Physical Exam Vitals and nursing note reviewed.  Constitutional:      General: She is not in acute distress.    Appearance: She is obese. She is not toxic-appearing or diaphoretic.  HENT:     Head: Normocephalic and atraumatic.     Nose: Nose normal.  Eyes:     General: No scleral icterus. Cardiovascular:     Rate and Rhythm: Normal rate and regular rhythm.  Pulmonary:     Effort: Pulmonary effort is normal.     Breath sounds: Normal breath sounds.  Abdominal:     General: Bowel sounds are normal. There is no distension.     Palpations: Abdomen is soft.  Musculoskeletal:     Comments: +erythema, swelling, induration of dorsum of right foot. See pictures  Skin:    Capillary Refill: Capillary refill takes less than 2 seconds.     Findings: Erythema and lesion present.     Comments: See picture of right foot  Neurological:     Mental Status: She is alert and oriented to person, place, and time.         Data Reviewed: I have personally  reviewed following labs and imaging studies  CBC: Recent Labs  Lab 06/20/23 1726 06/21/23 0420  WBC 9.0 9.2  NEUTROABS 5.8  --   HGB 15.3* 13.9  HCT 45.9 42.6  MCV 95.2 95.9  PLT 229 229   Basic Metabolic Panel: Recent Labs  Lab 06/20/23 1726 06/21/23 0420  NA 131* 131*  K 4.2 4.3  CL 99 102  CO2 20* 20*  GLUCOSE 196* 205*  BUN 17 16  CREATININE 0.59 0.47  CALCIUM 9.5 9.0   GFR: Estimated  Creatinine Clearance: 62.8 mL/min (by C-G formula based on SCr of 0.47 mg/dL).  HbA1C: Recent Labs    06/20/23 1726  HGBA1C 10.0*   CBG: Recent Labs  Lab 06/21/23 0100 06/21/23 0319 06/21/23 0738 06/21/23 1136  GLUCAP 198* 238* 220* 203*   Radiology Studies: MR FOOT RIGHT W WO CONTRAST Result Date: 06/21/2023 CLINICAL DATA:  Foot swelling, diabetic, osteomyelitis suspected, xray done EXAM: MRI OF THE RIGHT FOREFOOT WITHOUT AND WITH CONTRAST TECHNIQUE: Multiplanar, multisequence MR imaging of the right forefoot was performed before and after the administration of intravenous contrast. CONTRAST:  10mL GADAVIST GADOBUTROL 1 MMOL/ML IV SOLN COMPARISON:  X-ray 06/20/2023 FINDINGS: Technical Note: Despite efforts by the technologist and patient, motion artifact is present on today's exam and could not be eliminated. This reduces exam sensitivity and specificity. Bones/Joint/Cartilage No acute fracture. No dislocation. Joint spaces are maintained. Minimal degenerative changes, most notably at the first MTP joint. No joint effusion or erosion. No bone marrow edema or periostitis. No marrow replacing bone lesion. Ligaments Intact Lisfranc ligament.  Intact collateral ligaments. Muscles and Tendons Diffuse edema-like signal of the foot musculature which may represent changes related to denervation and/or myositis. Intact flexor and extensor tendons without tendinosis, tear, or tenosynovitis. Soft tissues Generalized soft tissue edema throughout the forefoot. Fluid collection within the plantar  soft tissues of the distal forefoot underlying the third and fourth toes measuring approximately 3.0 x 1.3 x 1.3 cm (series 7, image 12). No well-defined peripheral enhancement. No discernible wound or ulceration. IMPRESSION: 1. No evidence of osteomyelitis of the right forefoot. 2. Diffuse soft tissue edema with fluid collection within the plantar soft tissues of the distal forefoot underlying the third and fourth toes measuring approximately 3.0 x 1.3 x 1.3 cm. No well-defined peripheral enhancement. Appearance favors phlegmon/developing abscess. 3. Diffuse edema-like signal of the foot musculature which may represent changes related to denervation and/or myositis. Electronically Signed   By: Duanne Guess D.O.   On: 06/21/2023 11:30   DG Foot Complete Right Result Date: 06/20/2023 CLINICAL DATA:  Provided history: Trauma, cellulitis. EXAM: RIGHT FOOT COMPLETE - 3+ VIEW; RIGHT ANKLE - COMPLETE 3+ VIEW COMPARISON:  None Available. FINDINGS: Ankle: No fracture or dislocation. The ankle mortise is preserved. No erosions or bony destructive change. No convincing ankle joint effusion. Generalized soft tissue edema. No radiopaque foreign body or soft tissue gas vascular calcifications are seen. Foot: No acute fracture or dislocation. Flattening of the second metatarsal head. Mild midfoot degenerative dorsal spurring. No erosions or bony destructive change. Generalized soft tissue edema. No radiopaque foreign body or soft tissue gas. IMPRESSION: 1. Generalized soft tissue edema. No radiopaque foreign body or soft tissue gas. No radiographic findings of osteomyelitis. 2. No acute fracture or dislocation of the right ankle or foot. 3. 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 Electronically Signed   By: Narda Rutherford M.D.   On: 06/20/2023 19:30   DG Ankle Complete Right Result Date: 06/20/2023 CLINICAL DATA:  Provided history: Trauma, cellulitis. EXAM: RIGHT FOOT COMPLETE -  3+ VIEW; RIGHT ANKLE - COMPLETE 3+ VIEW COMPARISON:  None Available. FINDINGS: Ankle: No fracture or dislocation. The ankle mortise is preserved. No erosions or bony destructive change. No convincing ankle joint effusion. Generalized soft tissue edema. No radiopaque foreign body or soft tissue gas vascular calcifications are seen. Foot: No acute fracture or dislocation. Flattening of the second metatarsal head. Mild midfoot degenerative dorsal spurring. No erosions or bony destructive change. Generalized  soft tissue edema. No radiopaque foreign body or soft tissue gas. IMPRESSION: 1. Generalized soft tissue edema. No radiopaque foreign body or soft tissue gas. No radiographic findings of osteomyelitis. 2. No acute fracture or dislocation of the right ankle or foot. 3. 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 Electronically Signed   By: Narda Rutherford M.D.   On: 06/20/2023 19:30   Scheduled Meds:  [START ON 06/22/2023] amLODipine  10 mg Oral Daily   aspirin  81 mg Oral Daily   carvedilol  3.125 mg Oral BID   cholecalciferol  5,000 Units Oral Daily   enoxaparin (LOVENOX) injection  40 mg Subcutaneous Q24H   insulin aspart  0-15 Units Subcutaneous TID WC   insulin aspart  0-5 Units Subcutaneous QHS   insulin aspart protamine- aspart  15 Units Subcutaneous Once   insulin aspart protamine- aspart  30 Units Subcutaneous Q supper   [START ON 06/22/2023] insulin aspart protamine- aspart  40 Units Subcutaneous Q breakfast   magnesium oxide  400 mg Oral Daily   melatonin  6 mg Oral QHS   multivitamin with minerals  1 tablet Oral Daily   multivitamin-lutein  1 capsule Oral BID   omega-3 acid ethyl esters  1 g Oral Daily   Continuous Infusions:  sodium chloride 100 mL/hr at 06/21/23 1236   ceFEPime (MAXIPIME) IV 2 g (06/21/23 0947)   [START ON 06/22/2023] vancomycin       LOS: 1 day   Time spent: 40 minutes  Carollee Herter, DO  [facility]  06/21/2023, 12:47  PM

## 2023-06-21 NOTE — Progress Notes (Addendum)
 Pt reports that she usually takes 35 units at night of the 70/30 not 75 u. She also reports that she has been wearing the libre due to hypoglycemia. She has not ate well today.  This RN spoke with Dr Arville Care who advises to give 15 units of 70/30 tonight instead of the 75 units since she is not eating. [employer] RN

## 2023-06-21 NOTE — TOC CM/SW Note (Signed)
 Transition of Care [facility]) - Inpatient Brief Assessment   Patient Details  Name: Jessica Cuevas MRN: [id number] Date of Birth: [birth year]  Transition of Care [facility]) CM/SW Contact:    Isabella Bowens, LCSWA Phone Number: 06/21/2023, 7:34 AM   Clinical Narrative:  Transition of Care Department [facility]) has reviewed patient and no TOC needs have been identified at this time. We will continue to monitor patient advancement through interdisciplinary progression rounds. If new patient transition needs arise, please place a TOC consult.  Transition of Care Asessment: Insurance and Status: Insurance coverage has been reviewed Patient has primary care physician: Yes Home environment has been reviewed: Single Family Home Prior level of function:: Independent Prior/Current Home Services: No current home services Social Drivers of Health Review: SDOH reviewed no interventions necessary Readmission risk has been reviewed: Yes Transition of care needs: no transition of care needs at this time

## 2023-06-21 NOTE — Hospital Course (Signed)
 HPI: Jessica Cuevas is a 78 y.o. female with medical history significant for type 2 diabetes mellitus, hypertension, dyslipidemia and dementia, who presented to the emergency room with acute onset of worsening right foot swelling that started on its dorsum and was noted to have significant worsening today.  She had any fever or chills.  No nausea or vomiting or abdominal pain.  No chest pain or palpitations.  No cough or wheezing hemoptysis.  He stated that before his cellulitis started he had a podiatry appointment a couple months ago for ingrown toenail and apparently had a scalpel resection of a callus by her podiatrist.  The patient's blood glucose has been dropping lately.  She has been cutting down her long-acting insulin from 75 to 35 units.   Significant Events: Admitted 06/20/2023 for right foot cellulitis   Significant Labs: WBC 9.0, HgB 15.3, plt 229 Na 131, K 4.2, CO2 of 20, BUN 17, Scr 0.59, glu 196  Significant Imaging Studies: Right foot/ankle XR Generalized soft tissue edema. No radiopaque foreign body or soft tissue gas. No radiographic findings of osteomyelitis. 2. No acute fracture or dislocation of the right ankle or foot. 3. 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  Antibiotic Therapy: Anti-infectives (From admission, onward)    Start     Dose/Rate Route Frequency Ordered Stop   06/21/23 0800  ceFEPIme (MAXIPIME) 2 g in sodium chloride 0.9 % 100 mL IVPB        2 g 200 mL/hr over 30 Minutes Intravenous Every 8 hours 06/20/23 2241     06/20/23 2245  vancomycin (VANCOCIN) IVPB 1000 mg/200 mL premix  Status:  Discontinued        1,000 mg 200 mL/hr over 60 Minutes Intravenous  Once 06/20/23 2230 06/20/23 2241   06/20/23 2245  ceFEPIme (MAXIPIME) 2 g in sodium chloride 0.9 % 100 mL IVPB        2 g 200 mL/hr over 30 Minutes Intravenous  Once 06/20/23 2230 06/21/23 0016   06/20/23 2245  vancomycin (VANCOREADY) IVPB 2000 mg/400 mL         2,000 mg 200 mL/hr over 120 Minutes Intravenous  Once 06/20/23 2241 06/21/23 0309       Procedures:   Consultants:

## 2023-06-21 NOTE — Inpatient Diabetes Management (Addendum)
 Inpatient Diabetes Program Recommendations  AACE/ADA: New Consensus Statement on Inpatient Glycemic Control   Target Ranges:  Prepandial:   less than 140 mg/dL      Peak postprandial:   less than 180 mg/dL (1-2 hours)      Critically ill patients:  140 - 180 mg/dL    Latest Reference Range & Units 06/21/23 01:00 06/21/23 03:19 06/21/23 07:38  Glucose-Capillary 70 - 99 mg/dL 914 (H) 782 (H)  Novolog 2 units 220 (H)  Novolog 5 units  70/30 75 units @ 8:17    Review of Glycemic Control  Diabetes history: DM2 Outpatient Diabetes medications: 70/30 50 units QAM, 70/30 35-45 units QPM (prescribed 70/30 65 units QAM, 45 units QPM - per Dr. Pricilla Handler note on 06/15/23) Current orders for Inpatient glycemic control: 70/30 75 units BID, Novolog 0-15 units TID with meals, Novolog 0-5 units QHS  Inpatient Diabetes Program Recommendations:    Insulin: Patient received 70/30 75 units this morning. Per chart, patient has been having issues with hypoglycemia as an outpatient. Please consider decreasing 70/30 to 40 units QAM and 70/30 30 units QPM.  NOTE: Patient admitted with right foot cellulitis. In reviewing chart, noted patient sees Dr. Tedd Sias (Endocrinologist) and was seen on 06/15/23. Per office note patient was not checking glucose and a FreeStyle Libre2 sensor was applied and patient was advised to use the receiver to start monitoring glucose. Patient was asked to take 70/30 65 units QAM and 45 units QPM. Per B. Super, RN's note on 06/21/23 patient reported she usually takes 35 units of 70/30 at night and she has been having hypoglycemia recently. Will plan to speak with patient today regarding DM and hypoglycemia.  Addendum 06/21/23@11 :15-Spoke with patient at bedside about diabetes and home regimen for diabetes control. Patient reports taking 70/30 50 units QAM and 70/30 35-45 units QPM as an outpatient for diabetes control. Patient reports taking DM medications as prescribed but she does decrease the  dose at times depending on glucose. Patient has recently been using FreeStyle Libre2 CGM and she reports that her glucose has been mainly 100-200's mg/dl. She states that she has not had any lows over the past week and she actually feels that glucose has been higher since her foot started hurting.  Patient states her FreeStyle Libre 2 sensor was removed prior to MRI today.   Reviewed A1C 10% on 06/20/23 (was 10.5% on 06/15/23 per Dr. Pricilla Handler office note).   Discussed glucose and A1C goals. Discussed importance of checking CBGs and maintaining good CBG control to prevent long-term and short-term complications. Explained how hyperglycemia leads to damage within blood vessels which lead to the common complications seen with uncontrolled diabetes. Stressed to the patient the importance of improving glycemic control to prevent further complications from uncontrolled diabetes. Discussed that she was given 70/30 75 units this morning so if she felt she was getting any symptoms of hypoglycemia, encouraged patient to notify nursing staff so they can check glucose and treat hypoglycemia if needed. Patient states she has all needed DM meds and supplies at home.   Patient verbalized understanding of information discussed and reports no further questions at this time related to diabetes.  Thanks, Orlando Penner, RN, MSN, CDCES Diabetes Coordinator Inpatient Diabetes Program [phone] (Team Pager from 8am to 5pm)

## 2023-06-21 NOTE — Assessment & Plan Note (Addendum)
 Body mass index is 41.57 kg/m.

## 2023-06-21 NOTE — Plan of Care (Signed)
   Problem: Education: Goal: Knowledge of General Education information will improve Description Including pain rating scale, medication(s)/side effects and non-pharmacologic comfort measures Outcome: Progressing   Problem: Health Behavior/Discharge Planning: Goal: Ability to manage health-related needs will improve Outcome: Progressing

## 2023-06-21 NOTE — Assessment & Plan Note (Addendum)
 06-21-2023 MRI shows right foot abscess. Pt states she had a callus shaved off the bottom of her foot by a podiatrist. Does not remember name of podiatrist. MRI right foot shows right foot abscess. I contacted ortho at [facility]. Dr. Lajoyce Corners is out of town this week. Will attempt to locate a podiatrist at [facility] if possible. Continue with IV ABX for now.  06-22-2023 surgery consult reviewed and appreciated. No surgical options offered. Pt with severe PVD. Agree that pt's chances of healing would be quite poor. She will need 2-3 weeks of po abx therapy at home. F/u with outpatient podiatry after discharged.  06-23-2023 continue with IV abx. Pt wants outpatient referral to podiatry in [place]. Referral made in DC orders to [facility] in [place].  06-24-2023 right foot continues to improve.  Still decreasing amounts of erythema.  Much less edema than on admission.  Still having tenderness around the base of the right third and fourth metatarsal head.  Continue with IV antibiotics with cefepime and vancomycin.  Can switch to [facility] and doxycycline at discharge.  06-25-2023 surgery intervention on right foot abscess contraindicated due to poor peripheral circulation. Continue with po abx for 2-3 weeks. Ambulatory referral to [facility] in [place].

## 2023-06-21 NOTE — Assessment & Plan Note (Addendum)
 06-21-2023 continue with SSI. And 70/30.  06-22-2023 CBGs acceptable  06-23-2023 CBGs acceptable. On 70/30 and SSI.  06-24-2023 stable.  Stable CBGs.  Continue 70/30 and sliding scale insulin.  03-17-20225 DC to SNF on 70/30. 40 u qam with breakfast, 30 units qpm with dinner. Add SSI.

## 2023-06-21 NOTE — Assessment & Plan Note (Addendum)
 On admission. continue antihypertensive therapy.  06-21-2023 hold ACEI while on IV Vanco. Change to po norvasc 10 mg daily for now. Continue coreg 3.125 mg bid.  06-22-2023 on norvasc 10 mg every day and coreg 3.125 mg bid. Can increase coreg to 12.5 mg bid for additional BP control if needed. HR is 75 bpm.  06-24-2023 Coreg was increased to 12.5 mg twice daily.  Continue this for now.  Continue Norvasc 10 mg daily.  Heart rate 80 beats a minute.  06-25-2023 resume ACEI at discharge. Lisinopril 10 mg qday and coreg 6.125 mg bid.

## 2023-06-21 NOTE — Consult Note (Signed)
 [facility] Consult  Reason for Consult: Right foot osteomyelitis, possible abscess on MRI Referring Physician: Dr. Imogene Burn  Chief Complaint   Cellulitis     HPI: Jessica Cuevas is a 78 y.o. female who presented to the hospital with a 7 to 10-day history of worsening swelling, erythema, and pain to her right foot.  Several months ago, she underwent scraping of a callus by a podiatrist, and she believes this area adequately healed afterwards.  She subsequently went to [place] for a wedding at the end of February, at which time she was required to walk a significant amount.  She noted she started having increased pain and swelling of her right foot towards the end of the trip, and starting on 3/5, the area started getting significantly more red.  She was evaluated by her primary care doctor for this on 3/7, and was placed on Bactrim.  The area has failed to improve, which resulted with her presenting to the emergency department.  She has a past medical history significant for diabetes, hyperlipidemia, peripheral vascular disease, and hypertension.  Her surgical history is significant for an aortic valve replacement.  She has been evaluated by vascular surgery for claudication in her bilateral lower extremities.  Ultrasound with ABIs from September 2024 demonstrated right lower extremity ABI of 0.5 and left lower extremity ABI of 0.79, consistent with moderate to severe right lower extremity PAD, and moderate left lower extremity PAD.  At that time, she was recommended lifestyle modifications with plan for repeat ABI and carotid duplex in 1 year.  In the ED, she was noted to have erythema of the dorsum and plantar aspects of her right foot.  She was hemodynamically stable.  She had no leukocytosis on initial blood work.  She underwent right foot and ankle x-rays which demonstrated soft tissue swelling without osteomyelitis.  She subsequently underwent an MRI of the right foot today,  which demonstrated no evidence of osteomyelitis, but diffuse soft tissue edema with a 3 x 1.3 x 1.3 cm possible fluid collection without defined peripheral enhancement, favored to be phlegmon/developing abscess.  Today, she still has a fair amount of pain in her foot.  She has no other acute complaints at this time.  Past Medical History:  Diagnosis Date   Diabetes mellitus without complication (HCC)    Hyperlipidemia    Hypertension     Past Surgical History:  Procedure Laterality Date   AORTIC VALVE REPLACEMENT     BREAST EXCISIONAL BIOPSY Left    benign excision    Family History  Problem Relation Age of Onset   Breast cancer Neg Hx     Social History   Tobacco Use   Smoking status: Never   Smokeless tobacco: Never  Vaping Use   Vaping status: Never Used  Substance Use Topics   Alcohol use: No   Drug use: Never    Medications: I have reviewed the patient's current medications.  Allergies  Allergen Reactions   Aloe Rash   Metformin And Related     Had lactic acidosis   Penicillins Rash   Tape Rash     ROS:  Pertinent items are noted in HPI.  Blood pressure (!) 122/53, pulse 69, temperature 98.7 F (37.1 C), temperature source Oral, resp. rate 18, height 5\' 1"  (1.549 m), weight 99.8 kg, SpO2 96%. Physical Exam Vitals reviewed.  Constitutional:      Appearance: Normal appearance.  HENT:     Head: Normocephalic.  Eyes:  Extraocular Movements: Extraocular movements intact.     Pupils: Pupils are equal, round, and reactive to light.  Cardiovascular:     Rate and Rhythm: Normal rate.     Pulses:          Dorsalis pedis pulses are 0 on the right side and 1+ on the left side.       Posterior tibial pulses are 1+ on the right side and 1+ on the left side.  Pulmonary:     Effort: Pulmonary effort is normal.  Abdominal:     General: There is no distension.     Palpations: Abdomen is soft.     Tenderness: There is no abdominal tenderness.   Musculoskeletal:        General: Normal range of motion.     Cervical back: Normal range of motion.  Feet:     Comments: Erythema and edema to the right foot dorsum and plantar aspects, no open wounds present, exquisitely tender to palpation in the areas of erythema, no palpable fluctuance on the plantar aspect of the foot at the base of the second, third, or fourth toes (location of possible fluid collection on MRI) Skin:    General: Skin is warm and dry.  Neurological:     General: No focal deficit present.     Mental Status: She is alert and oriented to person, place, and time.  Psychiatric:        Mood and Affect: Mood normal.        Behavior: Behavior normal.     Results: Results for orders placed or performed during the hospital encounter of 06/20/23 (from the past 48 hours)  CBC with Differential     Status: Abnormal   Collection Time: 06/20/23  5:26 PM  Result Value Ref Range   WBC 9.0 4.0 - 10.5 K/uL   RBC 4.82 3.87 - 5.11 MIL/uL   Hemoglobin 15.3 (H) 12.0 - 15.0 g/dL   HCT 62.9 52.8 - 41.3 %   MCV 95.2 80.0 - 100.0 fL   MCH 31.7 26.0 - 34.0 pg   MCHC 33.3 30.0 - 36.0 g/dL   RDW 24.4 01.0 - 27.2 %   Platelets 229 150 - 400 K/uL   nRBC 0.0 0.0 - 0.2 %   Neutrophils Relative % 64 %   Neutro Abs 5.8 1.7 - 7.7 K/uL   Lymphocytes Relative 21 %   Lymphs Abs 1.8 0.7 - 4.0 K/uL   Monocytes Relative 12 %   Monocytes Absolute 1.0 0.1 - 1.0 K/uL   Eosinophils Relative 1 %   Eosinophils Absolute 0.1 0.0 - 0.5 K/uL   Basophils Relative 1 %   Basophils Absolute 0.1 0.0 - 0.1 K/uL   Immature Granulocytes 1 %   Abs Immature Granulocytes 0.06 0.00 - 0.07 K/uL    Comment: Performed at [facility], [address], [place], [place] [address]  Basic metabolic panel     Status: Abnormal   Collection Time: 06/20/23  5:26 PM  Result Value Ref Range   Sodium 131 (L) 135 - 145 mmol/L   Potassium 4.2 3.5 - 5.1 mmol/L   Chloride 99 98 - 111 mmol/L   CO2 20 (L) 22 - 32 mmol/L    Glucose, Bld 196 (H) 70 - 99 mg/dL    Comment: Glucose reference range applies only to samples taken after fasting for at least 8 hours.   BUN 17 8 - 23 mg/dL   Creatinine, Ser 4.03 0.44 - 1.00 mg/dL  Calcium 9.5 8.9 - 10.3 mg/dL   GFR, Estimated >16 >10 mL/min    Comment: (NOTE) Calculated using the CKD-EPI Creatinine Equation (2021)    Anion gap 12 5 - 15    Comment: Performed at [facility], [address], [place], [place] [address]  Hemoglobin A1c     Status: Abnormal   Collection Time: 06/20/23  5:26 PM  Result Value Ref Range   Hgb A1c MFr Bld 10.0 (H) 4.8 - 5.6 %    Comment: (NOTE) Pre diabetes:          5.7%-6.4%  Diabetes:              >6.4%  Glycemic control for   <7.0% adults with diabetes    Mean Plasma Glucose 240.3 mg/dL    Comment: Performed at [facility], [address], [place], [place] [address]  Glucose, capillary     Status: Abnormal   Collection Time: 06/21/23  1:00 AM  Result Value Ref Range   Glucose-Capillary 198 (H) 70 - 99 mg/dL    Comment: Glucose reference range applies only to samples taken after fasting for at least 8 hours.  Glucose, capillary     Status: Abnormal   Collection Time: 06/21/23  3:19 AM  Result Value Ref Range   Glucose-Capillary 238 (H) 70 - 99 mg/dL    Comment: Glucose reference range applies only to samples taken after fasting for at least 8 hours.  Basic metabolic panel     Status: Abnormal   Collection Time: 06/21/23  4:20 AM  Result Value Ref Range   Sodium 131 (L) 135 - 145 mmol/L   Potassium 4.3 3.5 - 5.1 mmol/L   Chloride 102 98 - 111 mmol/L   CO2 20 (L) 22 - 32 mmol/L   Glucose, Bld 205 (H) 70 - 99 mg/dL    Comment: Glucose reference range applies only to samples taken after fasting for at least 8 hours.   BUN 16 8 - 23 mg/dL   Creatinine, Ser 1.91 0.44 - 1.00 mg/dL   Calcium 9.0 8.9 - 47.8 mg/dL   GFR, Estimated >29 >56 mL/min    Comment: (NOTE) Calculated using the CKD-EPI Creatinine Equation (2021)     Anion gap 9 5 - 15    Comment: Performed at [facility], [address], [place], [place] [address]  CBC     Status: None   Collection Time: 06/21/23  4:20 AM  Result Value Ref Range   WBC 9.2 4.0 - 10.5 K/uL   RBC 4.44 3.87 - 5.11 MIL/uL   Hemoglobin 13.9 12.0 - 15.0 g/dL   HCT 65.7 84.6 - 96.2 %   MCV 95.9 80.0 - 100.0 fL   MCH 31.3 26.0 - 34.0 pg   MCHC 32.6 30.0 - 36.0 g/dL   RDW 95.2 84.1 - 32.4 %   Platelets 229 150 - 400 K/uL   nRBC 0.0 0.0 - 0.2 %    Comment: Performed at [facility], [address], [place], [place] [address]  Glucose, capillary     Status: Abnormal   Collection Time: 06/21/23  7:38 AM  Result Value Ref Range   Glucose-Capillary 220 (H) 70 - 99 mg/dL    Comment: Glucose reference range applies only to samples taken after fasting for at least 8 hours.  Glucose, capillary     Status: Abnormal   Collection Time: 06/21/23 11:36 AM  Result Value Ref Range   Glucose-Capillary 203 (H) 70 -  99 mg/dL    Comment: Glucose reference range applies only to samples taken after fasting for at least 8 hours.    MR FOOT RIGHT W WO CONTRAST Result Date: 06/21/2023 CLINICAL DATA:  Foot swelling, diabetic, osteomyelitis suspected, xray done EXAM: MRI OF THE RIGHT FOREFOOT WITHOUT AND WITH CONTRAST TECHNIQUE: Multiplanar, multisequence MR imaging of the right forefoot was performed before and after the administration of intravenous contrast. CONTRAST:  10mL GADAVIST GADOBUTROL 1 MMOL/ML IV SOLN COMPARISON:  X-ray 06/20/2023 FINDINGS: Technical Note: Despite efforts by the technologist and patient, motion artifact is present on today's exam and could not be eliminated. This reduces exam sensitivity and specificity. Bones/Joint/Cartilage No acute fracture. No dislocation. Joint spaces are maintained. Minimal degenerative changes, most notably at the first MTP joint. No joint effusion or erosion. No bone marrow edema or periostitis. No marrow replacing bone lesion. Ligaments Intact  Lisfranc ligament.  Intact collateral ligaments. Muscles and Tendons Diffuse edema-like signal of the foot musculature which may represent changes related to denervation and/or myositis. Intact flexor and extensor tendons without tendinosis, tear, or tenosynovitis. Soft tissues Generalized soft tissue edema throughout the forefoot. Fluid collection within the plantar soft tissues of the distal forefoot underlying the third and fourth toes measuring approximately 3.0 x 1.3 x 1.3 cm (series 7, image 12). No well-defined peripheral enhancement. No discernible wound or ulceration. IMPRESSION: 1. No evidence of osteomyelitis of the right forefoot. 2. Diffuse soft tissue edema with fluid collection within the plantar soft tissues of the distal forefoot underlying the third and fourth toes measuring approximately 3.0 x 1.3 x 1.3 cm. No well-defined peripheral enhancement. Appearance favors phlegmon/developing abscess. 3. Diffuse edema-like signal of the foot musculature which may represent changes related to denervation and/or myositis. Electronically Signed   By: Duanne Guess D.O.   On: 06/21/2023 11:30   DG Foot Complete Right Result Date: 06/20/2023 CLINICAL DATA:  Provided history: Trauma, cellulitis. EXAM: RIGHT FOOT COMPLETE - 3+ VIEW; RIGHT ANKLE - COMPLETE 3+ VIEW COMPARISON:  None Available. FINDINGS: Ankle: No fracture or dislocation. The ankle mortise is preserved. No erosions or bony destructive change. No convincing ankle joint effusion. Generalized soft tissue edema. No radiopaque foreign body or soft tissue gas vascular calcifications are seen. Foot: No acute fracture or dislocation. Flattening of the second metatarsal head. Mild midfoot degenerative dorsal spurring. No erosions or bony destructive change. Generalized soft tissue edema. No radiopaque foreign body or soft tissue gas. IMPRESSION: 1. Generalized soft tissue edema. No radiopaque foreign body or soft tissue gas. No radiographic findings of  osteomyelitis. 2. No acute fracture or dislocation of the right ankle or foot. 3. 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 Electronically Signed   By: Narda Rutherford M.D.   On: 06/20/2023 19:30   DG Ankle Complete Right Result Date: 06/20/2023 CLINICAL DATA:  Provided history: Trauma, cellulitis. EXAM: RIGHT FOOT COMPLETE - 3+ VIEW; RIGHT ANKLE - COMPLETE 3+ VIEW COMPARISON:  None Available. FINDINGS: Ankle: No fracture or dislocation. The ankle mortise is preserved. No erosions or bony destructive change. No convincing ankle joint effusion. Generalized soft tissue edema. No radiopaque foreign body or soft tissue gas vascular calcifications are seen. Foot: No acute fracture or dislocation. Flattening of the second metatarsal head. Mild midfoot degenerative dorsal spurring. No erosions or bony destructive change. Generalized soft tissue edema. No radiopaque foreign body or soft tissue gas. IMPRESSION: 1. Generalized soft tissue edema. No radiopaque foreign body or soft tissue gas. No  radiographic findings of osteomyelitis. 2. No acute fracture or dislocation of the right ankle or foot. 3. 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 Electronically Signed   By: Narda Rutherford M.D.   On: 06/20/2023 19:30     Assessment & Plan:  Jessica Cuevas is a 78 y.o. female who was admitted with right foot cellulitis.  Imaging and blood work evaluated by myself.  -I discussed the patient's cellulitis and imaging findings.  MRI is demonstrating a possible fluid collection, but also states that this could be a phlegmon versus developing abscess.  Given her significant vascular disease, I would recommend against incision and drainage for possible abscess at this time, as any wounds would have a difficult time healing.  There is no evidence of fluctuance on my examination -Would recommend conservative management with IV antibiotics at this time -If  patient's cellulitis/tenderness fails to improve over the next couple of days, she may require evaluation by vascular surgery.  Again would be concerned for any procedures to the foot, as she would have difficulty healing wounds on this foot with her vascular disease.  If she requires surgical procedures, she would also need evaluation by vascular to evaluate blood flow -No acute surgical intervention at this time -Continue IV antibiotics, currently on cefepime and vancomycin -Care per hospitalist  All questions were answered to the satisfaction of the patient and family.   Note: Portions of this report may have been transcribed using voice recognition software. Every effort has been made to ensure accuracy; however, inadvertent computerized transcription errors may still be present.   -- Theophilus Kinds, DO [facility] [address] [place], [place] [address] [phone] (office)

## 2023-06-21 NOTE — Assessment & Plan Note (Addendum)
 On admission. - The patient be admitted to a medical-surgical bed. - Will continue antibiotic therapy with IV cefepime and vancomycin. - MRI of the right foot is currently pending. - Podiatry consult can be obtained in a.m. - Pain management will be provided. - Warm compresses will be applied.  06-21-2023 Pt states she had a callus shaved off the bottom of her foot by a podiatrist. Does not remember name of podiatrist. MRI right foot shows right foot abscess. I contacted ortho at [facility]. Dr. Lajoyce Corners is out of town this week. Will attempt to locate a podiatrist at [facility] if possible. Continue with IV ABX for now.      06-22-2023 remains on IV Abx. Foot is less swollen and edematous.     06-23-2023 remains on IV Abx. Pt refusing to go to SNF. Wants podiatry referral in [place], [place]. Home health orders placed for PT/RN/nurse aide      06-24-2023 right foot continues to improve.  Still decreasing amounts of erythema.  Much less edema than on admission.  Still having tenderness around the base of the right third and fourth metatarsal head.  Continue with IV antibiotics with cefepime and vancomycin.  Can switch to [facility] and doxycycline at discharge.      06-25-2023 pt has SNF bed available today. Will continue po duricef/doxy/flagyl for 2 weeks.  06-20-2023    06-21-2023    06-22-2023    06-23-2023    06-24-2023    06-25-2023

## 2023-06-21 NOTE — Subjective & Objective (Addendum)
 Pt seen and examined.  Has bed offer at [facility] center. Pt has accepted bed offer. Does not need insurance authorization per CM. Ready for DC.

## 2023-06-21 NOTE — Plan of Care (Signed)

## 2023-06-22 DIAGNOSIS — I10 Essential (primary) hypertension: Secondary | ICD-10-CM | POA: Diagnosis not present

## 2023-06-22 DIAGNOSIS — L02611 Cutaneous abscess of right foot: Secondary | ICD-10-CM | POA: Diagnosis not present

## 2023-06-22 DIAGNOSIS — L03115 Cellulitis of right lower limb: Secondary | ICD-10-CM | POA: Diagnosis not present

## 2023-06-22 LAB — COMPREHENSIVE METABOLIC PANEL
ALT: 18 U/L (ref 0–44)
AST: 23 U/L (ref 15–41)
Albumin: 3.1 g/dL — ABNORMAL LOW (ref 3.5–5.0)
Alkaline Phosphatase: 38 U/L (ref 38–126)
Anion gap: 8 (ref 5–15)
BUN: 13 mg/dL (ref 8–23)
CO2: 19 mmol/L — ABNORMAL LOW (ref 22–32)
Calcium: 9.1 mg/dL (ref 8.9–10.3)
Chloride: 105 mmol/L (ref 98–111)
Creatinine, Ser: 0.39 mg/dL — ABNORMAL LOW (ref 0.44–1.00)
GFR, Estimated: >60 mL/min (ref >60)
Glucose, Bld: 145 mg/dL — ABNORMAL HIGH (ref 70–99)
Potassium: 4.3 mmol/L (ref 3.5–5.1)
Sodium: 132 mmol/L — ABNORMAL LOW (ref 135–145)
Total Bilirubin: 0.7 mg/dL (ref 0.0–1.2)
Total Protein: 7.3 g/dL (ref 6.5–8.1)

## 2023-06-22 LAB — GLUCOSE, CAPILLARY
Glucose-Capillary: 165 mg/dL — ABNORMAL HIGH (ref 70–99)
Glucose-Capillary: 205 mg/dL — ABNORMAL HIGH (ref 70–99)
Glucose-Capillary: 158 mg/dL — ABNORMAL HIGH (ref 70–99)
Glucose-Capillary: 142 mg/dL — ABNORMAL HIGH (ref 70–99)

## 2023-06-22 MED ORDER — HYDRALAZINE HCL 20 MG/ML IJ SOLN: 10.0000 mg | Freq: Four times a day (QID) | INTRAMUSCULAR | Status: DC | PRN

## 2023-06-22 NOTE — Progress Notes (Signed)
 PROGRESS NOTE    Jessica Cuevas  WGN:[id number] DOB: [birth year] DOA: 06/20/2023 PCP: Jerl Mina, MD  Subjective: Pt seen and examined.  Surgery consult reviewed and appreciated. No surgical option due to severe PVD.  Fortunately for patient, she seems to be responding well to abx.  Less erythema and edema of dorsum of right foot. See pictures.   Hospital Course: HPI: Jessica Cuevas is a 78 y.o. female with medical history significant for type 2 diabetes mellitus, hypertension, dyslipidemia and dementia, who presented to the emergency room with acute onset of worsening right foot swelling that started on its dorsum and was noted to have significant worsening today.  She had any fever or chills.  No nausea or vomiting or abdominal pain.  No chest pain or palpitations.  No cough or wheezing hemoptysis.  He stated that before his cellulitis started he had a podiatry appointment a couple months ago for ingrown toenail and apparently had a scalpel resection of a callus by her podiatrist.  The patient's blood glucose has been dropping lately.  She has been cutting down her long-acting insulin from 75 to 35 units.   Significant Events: Admitted 06/20/2023 for right foot cellulitis   Significant Labs: WBC 9.0, HgB 15.3, plt 229 Na 131, K 4.2, CO2 of 20, BUN 17, Scr 0.59, glu 196  Significant Imaging Studies: Right foot/ankle XR Generalized soft tissue edema. No radiopaque foreign body or soft tissue gas. No radiographic findings of osteomyelitis. 2. No acute fracture or dislocation of the right ankle or foot. 3. 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  Antibiotic Therapy: Anti-infectives (From admission, onward)    Start     Dose/Rate Route Frequency Ordered Stop   06/21/23 0800  ceFEPIme (MAXIPIME) 2 g in sodium chloride 0.9 % 100 mL IVPB        2 g 200 mL/hr over 30 Minutes Intravenous Every 8 hours 06/20/23 2241     06/20/23 2245  vancomycin  (VANCOCIN) IVPB 1000 mg/200 mL premix  Status:  Discontinued        1,000 mg 200 mL/hr over 60 Minutes Intravenous  Once 06/20/23 2230 06/20/23 2241   06/20/23 2245  ceFEPIme (MAXIPIME) 2 g in sodium chloride 0.9 % 100 mL IVPB        2 g 200 mL/hr over 30 Minutes Intravenous  Once 06/20/23 2230 06/21/23 0016   06/20/23 2245  vancomycin (VANCOREADY) IVPB 2000 mg/400 mL        2,000 mg 200 mL/hr over 120 Minutes Intravenous  Once 06/20/23 2241 06/21/23 0309       Procedures:   Consultants:     Assessment and Plan: * Cellulitis of right foot On admission. - The patient be admitted to a medical-surgical bed. - Will continue antibiotic therapy with IV cefepime and vancomycin. - MRI of the right foot is currently pending. - Podiatry consult can be obtained in a.m. - Pain management will be provided. - Warm compresses will be applied.  06-21-2023 Pt states she had a callus shaved off the bottom of her foot by a podiatrist. Does not remember name of podiatrist. MRI right foot shows right foot abscess. I contacted ortho at [facility]. Dr. Lajoyce Corners is out of town this week. Will attempt to locate a podiatrist at [facility] if possible. Continue with IV ABX for now.      06-22-2023 remains on IV Abx. Foot is less swollen and edematous.      Abscess of right foot  06-21-2023 MRI shows right foot abscess. Pt states she had a callus shaved off the bottom of her foot by a podiatrist. Does not remember name of podiatrist. MRI right foot shows right foot abscess. I contacted ortho at [facility]. Dr. Lajoyce Corners is out of town this week. Will attempt to locate a podiatrist at [facility] if possible. Continue with IV ABX for now.  06-22-2023 surgery consult reviewed and appreciated. No surgical options offered. Pt with severe PVD. Agree that pt's chances of healing would be quite poor. She will need 2-3 weeks of po abx therapy at home. F/u with outpatient podiatry after discharged.  Obesity, Class III, BMI 40-49.9 (morbid obesity)  (HCC) Estimated body mass index is 41.57 kg/m as calculated from the following:   Height as of this encounter: 5\' 1"  (1.549 m).   Weight as of this encounter: 99.8 kg.   Essential hypertension On admission. continue antihypertensive therapy.  06-21-2023 hold ACEI while on IV Vanco. Change to po norvasc 10 mg daily for now. Continue coreg 3.125 mg bid.  06-22-2023 on norvasc 10 mg every day and coreg 3.125 mg bid. Can increase coreg to 12.5 mg bid for additional BP control if needed. HR is 75 bpm.  Type 2 diabetes mellitus without complication, with long-term current use of insulin (HCC) 06-21-2023 continue with SSI. And 70/30.  06-22-2023 CBGs acceptable  DVT prophylaxis: enoxaparin (LOVENOX) injection 40 mg Start: 06/21/23 1000    Code Status: Full Code Family Communication: no family at bedside. Pt is decisional. Disposition Plan: return home.  Reason for continuing need for hospitalization: PT consult. Remains on IV Abx.  Objective: Vitals:   06/21/23 1438 06/21/23 2100 06/21/23 2138 06/22/23 0500  BP: (!) 122/53 (!) 127/58 (!) 127/58 (!) 159/85  Pulse: 69 75 75 75  Resp: 18 16  18   Temp: 98.7 F (37.1 C) 99 F (37.2 C)  98.4 F (36.9 C)  TempSrc: Oral Oral  Oral  SpO2: 96% 94%  99%  Weight:      Height:        Intake/Output Summary (Last 24 hours) at 06/22/2023 1053 Last data filed at 06/22/2023 0600 Gross per 24 hour  Intake 1241.65 ml  Output 2 ml  Net 1239.65 ml   Filed Weights   06/20/23 1609  Weight: 99.8 kg    Examination:  Physical Exam Vitals and nursing note reviewed.  Constitutional:      General: She is not in acute distress.    Appearance: She is obese. She is not ill-appearing, toxic-appearing or diaphoretic.  HENT:     Head: Normocephalic and atraumatic.     Nose: Nose normal.  Cardiovascular:     Rate and Rhythm: Normal rate and regular rhythm.  Pulmonary:     Effort: Pulmonary effort is normal. No respiratory distress.  Abdominal:      General: Bowel sounds are normal.     Palpations: Abdomen is soft.  Skin:    Capillary Refill: Capillary refill takes less than 2 seconds.     Comments: Less edema of dorsum of right foot. See pictures. More wrinkling of skin. Less erythema. Overall her cellulitis of right foot is improving.  Neurological:     Mental Status: She is alert and oriented to person, place, and time.        Data Reviewed: I have personally reviewed following labs and imaging studies  CBC: Recent Labs  Lab 06/20/23 1726 06/21/23 0420  WBC 9.0 9.2  NEUTROABS 5.8  --  HGB 15.3* 13.9  HCT 45.9 42.6  MCV 95.2 95.9  PLT 229 229   Basic Metabolic Panel: Recent Labs  Lab 06/20/23 1726 06/21/23 0420 06/22/23 0436  NA 131* 131* 132*  K 4.2 4.3 4.3  CL 99 102 105  CO2 20* 20* 19*  GLUCOSE 196* 205* 145*  BUN 17 16 13   CREATININE 0.59 0.47 0.39*  CALCIUM 9.5 9.0 9.1   GFR: Estimated Creatinine Clearance: 62.8 mL/min (A) (by C-G formula based on SCr of 0.39 mg/dL (L)). Liver Function Tests: Recent Labs  Lab 06/22/23 0436  AST 23  ALT 18  ALKPHOS 38  BILITOT 0.7  PROT 7.3  ALBUMIN 3.1*   HbA1C: Recent Labs    06/20/23 1726  HGBA1C 10.0*   CBG: Recent Labs  Lab 06/21/23 0738 06/21/23 1136 06/21/23 1643 06/21/23 2115 06/22/23 0723  GLUCAP 220* 203* 193* 129* 165*    Radiology Studies: MR FOOT RIGHT W WO CONTRAST Result Date: 06/21/2023 CLINICAL DATA:  Foot swelling, diabetic, osteomyelitis suspected, xray done EXAM: MRI OF THE RIGHT FOREFOOT WITHOUT AND WITH CONTRAST TECHNIQUE: Multiplanar, multisequence MR imaging of the right forefoot was performed before and after the administration of intravenous contrast. CONTRAST:  10mL GADAVIST GADOBUTROL 1 MMOL/ML IV SOLN COMPARISON:  X-ray 06/20/2023 FINDINGS: Technical Note: Despite efforts by the technologist and patient, motion artifact is present on today's exam and could not be eliminated. This reduces exam sensitivity and  specificity. Bones/Joint/Cartilage No acute fracture. No dislocation. Joint spaces are maintained. Minimal degenerative changes, most notably at the first MTP joint. No joint effusion or erosion. No bone marrow edema or periostitis. No marrow replacing bone lesion. Ligaments Intact Lisfranc ligament.  Intact collateral ligaments. Muscles and Tendons Diffuse edema-like signal of the foot musculature which may represent changes related to denervation and/or myositis. Intact flexor and extensor tendons without tendinosis, tear, or tenosynovitis. Soft tissues Generalized soft tissue edema throughout the forefoot. Fluid collection within the plantar soft tissues of the distal forefoot underlying the third and fourth toes measuring approximately 3.0 x 1.3 x 1.3 cm (series 7, image 12). No well-defined peripheral enhancement. No discernible wound or ulceration. IMPRESSION: 1. No evidence of osteomyelitis of the right forefoot. 2. Diffuse soft tissue edema with fluid collection within the plantar soft tissues of the distal forefoot underlying the third and fourth toes measuring approximately 3.0 x 1.3 x 1.3 cm. No well-defined peripheral enhancement. Appearance favors phlegmon/developing abscess. 3. Diffuse edema-like signal of the foot musculature which may represent changes related to denervation and/or myositis. Electronically Signed   By: Duanne Guess D.O.   On: 06/21/2023 11:30   DG Foot Complete Right Result Date: 06/20/2023 CLINICAL DATA:  Provided history: Trauma, cellulitis. EXAM: RIGHT FOOT COMPLETE - 3+ VIEW; RIGHT ANKLE - COMPLETE 3+ VIEW COMPARISON:  None Available. FINDINGS: Ankle: No fracture or dislocation. The ankle mortise is preserved. No erosions or bony destructive change. No convincing ankle joint effusion. Generalized soft tissue edema. No radiopaque foreign body or soft tissue gas vascular calcifications are seen. Foot: No acute fracture or dislocation. Flattening of the second metatarsal  head. Mild midfoot degenerative dorsal spurring. No erosions or bony destructive change. Generalized soft tissue edema. No radiopaque foreign body or soft tissue gas. IMPRESSION: 1. Generalized soft tissue edema. No radiopaque foreign body or soft tissue gas. No radiographic findings of osteomyelitis. 2. No acute fracture or dislocation of the right ankle or foot. 3. 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 Electronically  Signed   By: Narda Rutherford M.D.   On: 06/20/2023 19:30   DG Ankle Complete Right Result Date: 06/20/2023 CLINICAL DATA:  Provided history: Trauma, cellulitis. EXAM: RIGHT FOOT COMPLETE - 3+ VIEW; RIGHT ANKLE - COMPLETE 3+ VIEW COMPARISON:  None Available. FINDINGS: Ankle: No fracture or dislocation. The ankle mortise is preserved. No erosions or bony destructive change. No convincing ankle joint effusion. Generalized soft tissue edema. No radiopaque foreign body or soft tissue gas vascular calcifications are seen. Foot: No acute fracture or dislocation. Flattening of the second metatarsal head. Mild midfoot degenerative dorsal spurring. No erosions or bony destructive change. Generalized soft tissue edema. No radiopaque foreign body or soft tissue gas. IMPRESSION: 1. Generalized soft tissue edema. No radiopaque foreign body or soft tissue gas. No radiographic findings of osteomyelitis. 2. No acute fracture or dislocation of the right ankle or foot. 3. 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 Electronically Signed   By: Narda Rutherford M.D.   On: 06/20/2023 19:30    Scheduled Meds:  amLODipine  10 mg Oral Daily   aspirin  81 mg Oral Daily   carvedilol  3.125 mg Oral BID   cholecalciferol  5,000 Units Oral Daily   enoxaparin (LOVENOX) injection  40 mg Subcutaneous Q24H   insulin aspart  0-15 Units Subcutaneous TID WC   insulin aspart  0-5 Units Subcutaneous QHS   insulin aspart protamine- aspart  15 Units  Subcutaneous Once   insulin aspart protamine- aspart  30 Units Subcutaneous Q supper   insulin aspart protamine- aspart  40 Units Subcutaneous Q breakfast   magnesium oxide  400 mg Oral Daily   melatonin  6 mg Oral QHS   multivitamin with minerals  1 tablet Oral Daily   multivitamin-lutein  1 capsule Oral BID   omega-3 acid ethyl esters  1 g Oral Daily   Continuous Infusions:  ceFEPime (MAXIPIME) IV 2 g (06/22/23 0825)   vancomycin 1,000 mg (06/22/23 0131)     LOS: 2 days   Time spent: 45 minutes  Carollee Herter, DO  [facility]  06/22/2023, 10:53 AM

## 2023-06-22 NOTE — Evaluation (Signed)
 Physical Therapy Evaluation Patient Details Name: Jessica Cuevas MRN: [id number] DOB: Mar 30, 1946 Today's Date: 06/22/2023  History of Present Illness  Jessica Cuevas is a 78 y.o. female with medical history significant for type 2 diabetes mellitus, hypertension, dyslipidemia and dementia, who presented to the emergency room with acute onset of worsening right foot swelling that started on its dorsum and was noted to have significant worsening today.  She had any fever or chills.  No nausea or vomiting or abdominal pain.  No chest pain or palpitations.  No cough or wheezing hemoptysis.  He stated that before his cellulitis started he had a podiatry appointment a couple months ago for ingrown toenail and apparently had a scalpel resection of a callus by her podiatrist.  The patient's blood glucose has been dropping lately.  She has been cutting down her long-acting insulin from 75 to 35 units.  Clinical Impression  Pt presents with decreased tolerance with WB on RLE due to pain. Pt has not been moving much throughout the day, mobility referral put in and nursing notified. Pt would continue to benefit from skilled acute PT for improved gait pattern with 2WRW, gait speed, and increased LE strength for improved community ambulation and independence with ADLs. Pt recommended for SNF/STR for increased ability for home/community ambulation and improved tolerance for WB through RLE.      If plan is discharge home, recommend the following: A little help with walking and/or transfers;A lot of help with bathing/dressing/bathroom;Help with stairs or ramp for entrance;Assist for transportation   Can travel by private vehicle        Equipment Recommendations    Recommendations for Other Services       Functional Status Assessment Patient has had a recent decline in their functional status and demonstrates the ability to make significant improvements in function in a reasonable and predictable amount of time.      Precautions / Restrictions Precautions Precautions: Fall Recall of Precautions/Restrictions: Intact Restrictions Weight Bearing Restrictions Per Provider Order: No Other Position/Activity Restrictions: pt hesistant to put any weight on RLE      Mobility  Bed Mobility Overal bed mobility: Needs Assistance             General bed mobility comments: pt needs one hand assist and elevated HOB to move from supine to sitting EOB    Transfers Overall transfer level: Modified independent Equipment used: Rolling walker (2 wheels)                    Ambulation/Gait Ambulation/Gait assistance: Modified independent (Device/Increase time), Contact guard assist Gait Distance (Feet): 15 Feet Assistive device: Rolling walker (2 wheels) Gait Pattern/deviations: Decreased stance time - right, Decreased step length - left, Decreased stride length, Antalgic Gait velocity: slow     General Gait Details: decreased WB through RLe, pt able to partial WB with heel  Stairs            Wheelchair Mobility     Tilt Bed    Modified Rankin (Stroke Patients Only)       Balance Overall balance assessment: Modified Independent                                           Pertinent Vitals/Pain Pain Assessment Pain Assessment: 0-10 Pain Score: 5  Pain Location: R foot Pain Descriptors / Indicators: Sore Pain Intervention(s): Limited activity within  patient's tolerance, Monitored during session    Home Living Family/patient expects to be discharged to:: Private residence Living Arrangements: Alone Available Help at Discharge: Other (Comment) (grandaughter on occassion) Type of Home: House Home Access: Stairs to enter Entrance Stairs-Rails: Right Entrance Stairs-Number of Steps: 5 stairs, ramp accessible   Home Layout: One level Home Equipment: Agricultural consultant (2 wheels);Shower seat      Prior Function Prior Level of Function : Independent/Modified  Independent             Mobility Comments: modified independent community ambulator ADLs Comments: modified independent ADL performance     Extremity/Trunk Assessment   Upper Extremity Assessment Upper Extremity Assessment: Overall WFL for tasks assessed    Lower Extremity Assessment Lower Extremity Assessment: Generalized weakness;RLE deficits/detail RLE Deficits / Details: Pt only partial WB through RLE, decreased toe movement, ankle WFL       Communication   Communication Communication: No apparent difficulties    Cognition Arousal: Alert Behavior During Therapy: WFL for tasks assessed/performed   PT - Cognitive impairments: No apparent impairments                         Following commands: Intact       Cueing Cueing Techniques: Verbal cues, Visual cues, Gestural cues     General Comments      Exercises     Assessment/Plan    PT Assessment Patient needs continued PT services  PT Problem List Decreased strength;Decreased activity tolerance;Decreased balance;Decreased mobility;Obesity;Pain       PT Treatment Interventions DME instruction;Gait training;Stair training;Functional mobility training;Therapeutic activities;Therapeutic exercise;Balance training;Neuromuscular re-education;Patient/family education    PT Goals (Current goals can be found in the Care Plan section)  Acute Rehab PT Goals Patient Stated Goal: to get rid of R foot pain and return home PT Goal Formulation: With patient Time For Goal Achievement: 07/06/23 Potential to Achieve Goals: Good    Frequency Min 2X/week     Co-evaluation               AM-PAC PT "6 Clicks" Mobility  Outcome Measure Help needed turning from your back to your side while in a flat bed without using bedrails?: A Little Help needed moving from lying on your back to sitting on the side of a flat bed without using bedrails?: A Lot Help needed moving to and from a bed to a chair (including a  wheelchair)?: A Little Help needed standing up from a chair using your arms (e.g., wheelchair or bedside chair)?: A Little Help needed to walk in hospital room?: A Little Help needed climbing 3-5 steps with a railing? : A Lot 6 Click Score: 16    End of Session   Activity Tolerance: Patient tolerated treatment well;Patient limited by pain Patient left: in chair;with call bell/phone within reach Nurse Communication: Mobility status;Weight bearing status PT Visit Diagnosis: Unsteadiness on feet (R26.81);Other abnormalities of gait and mobility (R26.89);Muscle weakness (generalized) (M62.81)    Time: 1610-9604 PT Time Calculation (min) (ACUTE ONLY): 29 min   Charges:   PT Evaluation $PT Eval Low Complexity: 1 Low PT Treatments $Therapeutic Activity: 23-37 mins PT General Charges $$ ACUTE PT VISIT: 1 Visit         Luz Lex, PT, DPT [facility] Office: [phone]

## 2023-06-22 NOTE — Plan of Care (Signed)
   Problem: Education: Goal: Knowledge of General Education information will improve Description Including pain rating scale, medication(s)/side effects and non-pharmacologic comfort measures Outcome: Progressing   Problem: Health Behavior/Discharge Planning: Goal: Ability to manage health-related needs will improve Outcome: Progressing

## 2023-06-22 NOTE — Care Management Important Message (Signed)
 Important Message  Patient Details  Name: Jessica Cuevas MRN: [id number] Date of Birth: 05-21-1945   Important Message Given:  Yes - Medicare IM     Corey Harold 06/22/2023, 11:50 AM

## 2023-06-22 NOTE — Plan of Care (Signed)
  Problem: Acute Rehab PT Goals(only PT should resolve) Goal: Pt Will Go Supine/Side To Sit Outcome: Progressing Flowsheets (Taken 06/22/2023 1536) Pt will go Supine/Side to Sit: Independently Goal: Patient Will Transfer Sit To/From Stand Outcome: Progressing Flowsheets (Taken 06/22/2023 1536) Patient will transfer sit to/from stand: Independently Goal: Pt Will Transfer Bed To Chair/Chair To Bed Outcome: Progressing Flowsheets (Taken 06/22/2023 1536) Pt will Transfer Bed to Chair/Chair to Bed:  with modified independence  with supervision Goal: Pt Will Ambulate Outcome: Progressing Flowsheets (Taken 06/22/2023 1536) Pt will Ambulate:  75 feet  with modified independence Note: With normalized gait pattern   Luz Lex, PT, DPT [facility] Office: [phone]

## 2023-06-23 DIAGNOSIS — L03115 Cellulitis of right lower limb: Secondary | ICD-10-CM | POA: Diagnosis not present

## 2023-06-23 DIAGNOSIS — L02611 Cutaneous abscess of right foot: Secondary | ICD-10-CM | POA: Diagnosis not present

## 2023-06-23 DIAGNOSIS — I10 Essential (primary) hypertension: Secondary | ICD-10-CM | POA: Diagnosis not present

## 2023-06-23 LAB — CBC WITH DIFFERENTIAL/PLATELET
Abs Immature Granulocytes: 0.06 10*3/uL (ref 0.00–0.07)
Basophils Absolute: 0.1 10*3/uL (ref 0.0–0.1)
Basophils Relative: 1 %
Eosinophils Absolute: 0.4 10*3/uL (ref 0.0–0.5)
Eosinophils Relative: 4 %
HCT: 41.8 % (ref 36.0–46.0)
Hemoglobin: 13.7 g/dL (ref 12.0–15.0)
Immature Granulocytes: 1 %
Lymphocytes Relative: 23 %
Lymphs Abs: 2.1 10*3/uL (ref 0.7–4.0)
MCH: 31.4 pg (ref 26.0–34.0)
MCHC: 32.8 g/dL (ref 30.0–36.0)
MCV: 95.9 fL (ref 80.0–100.0)
Monocytes Absolute: 1.1 10*3/uL — ABNORMAL HIGH (ref 0.1–1.0)
Monocytes Relative: 12 %
Neutro Abs: 5.5 10*3/uL (ref 1.7–7.7)
Neutrophils Relative %: 59 %
Platelets: 248 10*3/uL (ref 150–400)
RBC: 4.36 MIL/uL (ref 3.87–5.11)
RDW: 12.8 % (ref 11.5–15.5)
WBC: 9.2 10*3/uL (ref 4.0–10.5)
nRBC: 0 % (ref 0.0–0.2)

## 2023-06-23 LAB — GLUCOSE, CAPILLARY
Glucose-Capillary: 136 mg/dL — ABNORMAL HIGH (ref 70–99)
Glucose-Capillary: 234 mg/dL — ABNORMAL HIGH (ref 70–99)
Glucose-Capillary: 249 mg/dL — ABNORMAL HIGH (ref 70–99)
Glucose-Capillary: 237 mg/dL — ABNORMAL HIGH (ref 70–99)

## 2023-06-23 LAB — COMPREHENSIVE METABOLIC PANEL
ALT: 18 U/L (ref 0–44)
AST: 23 U/L (ref 15–41)
Albumin: 3 g/dL — ABNORMAL LOW (ref 3.5–5.0)
Alkaline Phosphatase: 38 U/L (ref 38–126)
Anion gap: 9 (ref 5–15)
BUN: 11 mg/dL (ref 8–23)
CO2: 21 mmol/L — ABNORMAL LOW (ref 22–32)
Calcium: 9.1 mg/dL (ref 8.9–10.3)
Chloride: 104 mmol/L (ref 98–111)
Creatinine, Ser: 0.39 mg/dL — ABNORMAL LOW (ref 0.44–1.00)
GFR, Estimated: >60 mL/min (ref >60)
Glucose, Bld: 125 mg/dL — ABNORMAL HIGH (ref 70–99)
Potassium: 3.8 mmol/L (ref 3.5–5.1)
Sodium: 134 mmol/L — ABNORMAL LOW (ref 135–145)
Total Bilirubin: 0.6 mg/dL (ref 0.0–1.2)
Total Protein: 7.1 g/dL (ref 6.5–8.1)

## 2023-06-23 MED ORDER — CARVEDILOL 12.5 MG PO TABS
12.5000 mg | ORAL_TABLET | Freq: Two times a day (BID) | ORAL | Status: DC
  Administered 2023-06-23 – 2023-06-25 (×5): 12.5 mg via ORAL
  Filled 2023-06-23 (×5): qty 1

## 2023-06-23 NOTE — Plan of Care (Signed)

## 2023-06-23 NOTE — Plan of Care (Signed)

## 2023-06-23 NOTE — Progress Notes (Signed)
 Pharmacy Antibiotic Note  Jessica Cuevas is a 78 y.o. female admitted on 06/20/2023 with cellulitis.  Pharmacy has been consulted for cefepime/vancomycin dosing. Patient started on Bactrim 3/7 with no improvement.  Patient currently afebrile, wbc wnl. Renal function remains normal.   Plan: -Cefepime 2g IV every 8 hours -Vancomycin 100mg  IV every 24 hours (AUC 492, Vd 0.5, IBW) -Monitor renal function -Follow up signs of clinical improvement, LOT, de-escalation of antibiotics   Height: 5\' 1"  (154.9 cm) Weight: 99.8 kg (220 lb) IBW/kg (Calculated) : 47.8  Temp (24hrs), Avg:98.2 F (36.8 C), Min:97.8 F (36.6 C), Max:98.5 F (36.9 C)  Recent Labs  Lab 06/20/23 1726 06/21/23 0420 06/22/23 0436 06/23/23 0501  WBC 9.0 9.2  --  9.2  CREATININE 0.59 0.47 0.39* 0.39*    Estimated Creatinine Clearance: 62.8 mL/min (A) (by C-G formula based on SCr of 0.39 mg/dL (L)).    Allergies  Allergen Reactions   Aloe Rash   Metformin And Related     Had lactic acidosis   Penicillins Rash   Tape Rash    Antimicrobials this admission: Cefepime 3/12 >>  Vancomycin 3/12 >>   Microbiology results: None  Thank you for allowing pharmacy to be a part of this patient's care.  Sheppard Coil PharmD., BCPS Clinical Pharmacist 06/23/2023 12:07 PM

## 2023-06-23 NOTE — Progress Notes (Signed)
   Called pt's dtr kristin and gave her update.  Carollee Herter, DO [facility]

## 2023-06-23 NOTE — TOC Progression Note (Signed)
 Transition of Care [facility]) - Progression Note    Patient Details  Name: Jessica Cuevas MRN: [id number] Date of Birth: 19-Jan-1946  Transition of Care [facility]) CM/SW Contact  Catalina Gravel, [place] Phone Number: 06/23/2023, 3:56 PM  Clinical Narrative:    CSW spoke to daughter of pt- who states she would like SNF for rehab and is POA. Pt lives alone, concern with her home alone, and family starting to explore Assisted Living.  Pt not yet in need of memory care but needs additional support. [place] to MD with info- MD states pt competent and not in agreement with SNF , while it was recommended by PT. CSW had initiated PASSR/FL2 should pt desire change.  TOC to follow.     Barriers to Discharge: Continued Medical Work up  Expected Discharge Plan and Services                                               Social Determinants of Health (SDOH) Interventions SDOH Screenings   Food Insecurity: No Food Insecurity (06/21/2023)  Housing: Low Risk  (06/20/2023)  Transportation Needs: No Transportation Needs (06/20/2023)  Utilities: Not At Risk (06/21/2023)  Depression (PHQ2-9): Low Risk  (08/24/2021)  Social Connections: Unknown (06/21/2023)  Tobacco Use: Low Risk  (06/20/2023)    Readmission Risk Interventions    06/21/2023    7:33 AM  Readmission Risk Prevention Plan  Medication Screening Complete  Transportation Screening Complete

## 2023-06-23 NOTE — Progress Notes (Signed)
 PROGRESS NOTE    Jessica Cuevas  QIO:[id number] DOB: [birth year] DOA: 06/20/2023 PCP: Jerl Mina, MD  Subjective: Pt seen and examined.   Pt refusing to go to SNF.  Right foot continue to improve.   Hospital Course: HPI: Jessica Cuevas is a 78 y.o. female with medical history significant for type 2 diabetes mellitus, hypertension, dyslipidemia and dementia, who presented to the emergency room with acute onset of worsening right foot swelling that started on its dorsum and was noted to have significant worsening today.  She had any fever or chills.  No nausea or vomiting or abdominal pain.  No chest pain or palpitations.  No cough or wheezing hemoptysis.  He stated that before his cellulitis started he had a podiatry appointment a couple months ago for ingrown toenail and apparently had a scalpel resection of a callus by her podiatrist.  The patient's blood glucose has been dropping lately.  She has been cutting down her long-acting insulin from 75 to 35 units.   Significant Events: Admitted 06/20/2023 for right foot cellulitis   Significant Labs: WBC 9.0, HgB 15.3, plt 229 Na 131, K 4.2, CO2 of 20, BUN 17, Scr 0.59, glu 196  Significant Imaging Studies: Right foot/ankle XR Generalized soft tissue edema. No radiopaque foreign body or soft tissue gas. No radiographic findings of osteomyelitis. 2. No acute fracture or dislocation of the right ankle or foot. 3. 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  Antibiotic Therapy: Anti-infectives (From admission, onward)    Start     Dose/Rate Route Frequency Ordered Stop   06/21/23 0800  ceFEPIme (MAXIPIME) 2 g in sodium chloride 0.9 % 100 mL IVPB        2 g 200 mL/hr over 30 Minutes Intravenous Every 8 hours 06/20/23 2241     06/20/23 2245  vancomycin (VANCOCIN) IVPB 1000 mg/200 mL premix  Status:  Discontinued        1,000 mg 200 mL/hr over 60 Minutes Intravenous  Once 06/20/23 2230 06/20/23 2241    06/20/23 2245  ceFEPIme (MAXIPIME) 2 g in sodium chloride 0.9 % 100 mL IVPB        2 g 200 mL/hr over 30 Minutes Intravenous  Once 06/20/23 2230 06/21/23 0016   06/20/23 2245  vancomycin (VANCOREADY) IVPB 2000 mg/400 mL        2,000 mg 200 mL/hr over 120 Minutes Intravenous  Once 06/20/23 2241 06/21/23 0309       Procedures:   Consultants:     Assessment and Plan: * Cellulitis of right foot On admission. - The patient be admitted to a medical-surgical bed. - Will continue antibiotic therapy with IV cefepime and vancomycin. - MRI of the right foot is currently pending. - Podiatry consult can be obtained in a.m. - Pain management will be provided. - Warm compresses will be applied.  06-21-2023 Pt states she had a callus shaved off the bottom of her foot by a podiatrist. Does not remember name of podiatrist. MRI right foot shows right foot abscess. I contacted ortho at [facility]. Dr. Lajoyce Corners is out of town this week. Will attempt to locate a podiatrist at [facility] if possible. Continue with IV ABX for now.      06-22-2023 remains on IV Abx. Foot is less swollen and edematous.     06-23-2023 remains on IV Abx. Pt refusing to go to SNF. Wants podiatry referral in [place], [place]. Home health orders placed for PT/RN/nurse aide  Abscess of right foot 06-21-2023 MRI shows right foot abscess. Pt states she had a callus shaved off the bottom of her foot by a podiatrist. Does not remember name of podiatrist. MRI right foot shows right foot abscess. I contacted ortho at [facility]. Dr. Lajoyce Corners is out of town this week. Will attempt to locate a podiatrist at [facility] if possible. Continue with IV ABX for now.  06-22-2023 surgery consult reviewed and appreciated. No surgical options offered. Pt with severe PVD. Agree that pt's chances of healing would be quite poor. She will need 2-3 weeks of po abx therapy at home. F/u with outpatient podiatry after discharged.  06-23-2023 continue with IV abx. Pt wants outpatient  referral to podiatry in [place]. Referral made in DC orders to [facility] in [place].  Obesity, Class III, BMI 40-49.9 (morbid obesity) (HCC) Estimated body mass index is 41.57 kg/m as calculated from the following:   Height as of this encounter: 5\' 1"  (1.549 m).   Weight as of this encounter: 99.8 kg.   Essential hypertension On admission. continue antihypertensive therapy.  06-21-2023 hold ACEI while on IV Vanco. Change to po norvasc 10 mg daily for now. Continue coreg 3.125 mg bid.  06-22-2023 on norvasc 10 mg every day and coreg 3.125 mg bid. Can increase coreg to 12.5 mg bid for additional BP control if needed. HR is 75 bpm.  Type 2 diabetes mellitus without complication, with long-term current use of insulin (HCC) 06-21-2023 continue with SSI. And 70/30.  06-22-2023 CBGs acceptable  06-23-2023 CBGs acceptable. On 70/30 and SSI.   DVT prophylaxis: enoxaparin (LOVENOX) injection 40 mg Start: 06/21/23 1000    Code Status: Full Code Family Communication: no family at bedside Disposition Plan: return home. Pt refusing SNF placement. Reason for continuing need for hospitalization: remains on IV Abx. Plan for DC on Monday.  Objective: Vitals:   06/22/23 1939 06/22/23 2119 06/23/23 0448 06/23/23 0802  BP: (!) 141/75 (!) 141/75 (!) 177/74 (!) 143/54  Pulse: 85 85 81 88  Resp: 20  18 18   Temp: 97.8 F (36.6 C)  98.5 F (36.9 C) 98.1 F (36.7 C)  TempSrc: Oral  Oral Oral  SpO2: 96%  98% 97%  Weight:      Height:        Intake/Output Summary (Last 24 hours) at 06/23/2023 1118 Last data filed at 06/23/2023 0900 Gross per 24 hour  Intake 480 ml  Output --  Net 480 ml   Filed Weights   06/20/23 1609  Weight: 99.8 kg    Examination:  Physical Exam Vitals and nursing note reviewed.  Constitutional:      General: She is not in acute distress.    Appearance: She is normal weight. She is not toxic-appearing.  HENT:     Head: Normocephalic and  atraumatic.     Nose: Nose normal.  Cardiovascular:     Rate and Rhythm: Normal rate and regular rhythm.  Pulmonary:     Effort: Pulmonary effort is normal.     Breath sounds: Normal breath sounds.  Abdominal:     General: Bowel sounds are normal.     Palpations: Abdomen is soft.  Skin:    Capillary Refill: Capillary refill takes less than 2 seconds.     Comments: Right foot continues to improve. See pictures. Less red erythema. Color is turning more chronic brown color.  Neurological:     General: No focal deficit present.     Mental Status: She  is alert and oriented to person, place, and time.        Data Reviewed: I have personally reviewed following labs and imaging studies  CBC: Recent Labs  Lab 06/20/23 1726 06/21/23 0420 06/23/23 0501  WBC 9.0 9.2 9.2  NEUTROABS 5.8  --  5.5  HGB 15.3* 13.9 13.7  HCT 45.9 42.6 41.8  MCV 95.2 95.9 95.9  PLT 229 229 248   Basic Metabolic Panel: Recent Labs  Lab 06/20/23 1726 06/21/23 0420 06/22/23 0436 06/23/23 0501  NA 131* 131* 132* 134*  K 4.2 4.3 4.3 3.8  CL 99 102 105 104  CO2 20* 20* 19* 21*  GLUCOSE 196* 205* 145* 125*  BUN 17 16 13 11   CREATININE 0.59 0.47 0.39* 0.39*  CALCIUM 9.5 9.0 9.1 9.1   GFR: Estimated Creatinine Clearance: 62.8 mL/min (A) (by C-G formula based on SCr of 0.39 mg/dL (L)). Liver Function Tests: Recent Labs  Lab 06/22/23 0436 06/23/23 0501  AST 23 23  ALT 18 18  ALKPHOS 38 38  BILITOT 0.7 0.6  PROT 7.3 7.1  ALBUMIN 3.1* 3.0*   HbA1C: Recent Labs    06/20/23 1726  HGBA1C 10.0*   CBG: Recent Labs  Lab 06/22/23 0723 06/22/23 1126 06/22/23 1634 06/22/23 2105 06/23/23 0731  GLUCAP 165* 205* 158* 142* 136*   Scheduled Meds:  amLODipine  10 mg Oral Daily   aspirin  81 mg Oral Daily   carvedilol  12.5 mg Oral BID   cholecalciferol  5,000 Units Oral Daily   enoxaparin (LOVENOX) injection  40 mg Subcutaneous Q24H   insulin aspart  0-15 Units Subcutaneous TID WC   insulin  aspart  0-5 Units Subcutaneous QHS   insulin aspart protamine- aspart  15 Units Subcutaneous Once   insulin aspart protamine- aspart  30 Units Subcutaneous Q supper   insulin aspart protamine- aspart  40 Units Subcutaneous Q breakfast   magnesium oxide  400 mg Oral Daily   melatonin  6 mg Oral QHS   multivitamin with minerals  1 tablet Oral Daily   multivitamin-lutein  1 capsule Oral BID   omega-3 acid ethyl esters  1 g Oral Daily   Continuous Infusions:  ceFEPime (MAXIPIME) IV 2 g (06/23/23 0837)   vancomycin 1,000 mg (06/23/23 0038)     LOS: 3 days   Time spent: 40 minutes  Carollee Herter, DO  [facility]  06/23/2023, 11:18 AM

## 2023-06-23 NOTE — [place] FL2 (Signed)
 [place] MEDICAID FL2 LEVEL OF CARE FORM     IDENTIFICATION  Patient Name: Sanya Kobrin Birthdate: 1945-10-24 Sex: female Admission Date (Current Location): 06/20/2023  [facility] and [place] Number:  [employer] and Address:  [facility],  [address], [place] [address]      Provider Number: [id number]  Attending Physician Name and Address:  Carollee Herter, DO  Relative Name and Phone Number:  Deveron Furlong (Daughter)  [phone] [facility])    Current Level of Care: Hospital Recommended Level of Care: Skilled Nursing Facility Prior Approval Number:    Date Approved/Denied:   PASRR Number: [id number]  Discharge Plan: SNF    Current Diagnoses: Patient Active Problem List   Diagnosis Date Noted   Abscess of right foot 06/21/2023   Obesity, Class III, BMI 40-49.9 (morbid obesity) (HCC) 06/21/2023   Cellulitis of right foot 06/20/2023   Carotid stenosis, 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 10/02/2021   Essential hypertension 10/02/2021   Hyperlipidemia 10/02/2021   Type 2 diabetes mellitus without complication, with long-term current use of insulin (HCC) 01/19/2017   Varicose veins of both lower extremities with pain 01/19/2017   Lower extremity pain, bilateral 01/19/2017    Orientation RESPIRATION BLADDER Height & Weight     Self, Situation  Normal Continent Weight: 220 lb (99.8 kg) Height:  5\' 1"  (154.9 cm)  BEHAVIORAL SYMPTOMS/MOOD NEUROLOGICAL BOWEL NUTRITION STATUS      Continent Diet  AMBULATORY STATUS COMMUNICATION OF NEEDS Skin   Limited Assist Verbally Other (Comment) (Wound foot)                       Personal Care Assistance Level of Assistance  Bathing, Feeding, Dressing Bathing Assistance: Limited assistance Feeding assistance: Limited assistance Dressing Assistance: Limited assistance     Functional Limitations Info  Sight, Hearing Sight Info: Adequate Hearing Info: Adequate      SPECIAL CARE FACTORS FREQUENCY  PT (By  licensed PT), OT (By licensed OT)     PT Frequency: 5 X a week OT Frequency: 5 X a week            Contractures Contractures Info: Not present    Additional Factors Info  Code Status, Allergies Code Status Info: Full Allergies Info: Allergies: Aloe, Metformin And Related, Penicillins, Tape           Current Medications (06/23/2023):  This is the current hospital active medication list Current Facility-Administered Medications  Medication Dose Route Frequency Provider Last Rate Last Admin   acetaminophen (TYLENOL) tablet 650 mg  650 mg Oral Q6H PRN Mansy, Jan A, MD   650 mg at 06/22/23 2120   Or   acetaminophen (TYLENOL) suppository 650 mg  650 mg Rectal Q6H PRN Mansy, Jan A, MD       amLODipine (NORVASC) tablet 10 mg  10 mg Oral Daily Carollee Herter, DO   10 mg at 06/23/23 2025   aspirin chewable tablet 81 mg  81 mg Oral Daily Mansy, Jan A, MD   81 mg at 06/23/23 0838   carvedilol (COREG) tablet 12.5 mg  12.5 mg Oral BID Carollee Herter, DO   12.5 mg at 06/23/23 0848   ceFEPIme (MAXIPIME) 2 g in sodium chloride 0.9 % 100 mL IVPB  2 g Intravenous Q8H Arabella Merles, RPH 200 mL/hr at 06/23/23 1546 2 g at 06/23/23 1546   cholecalciferol (VITAMIN D3) 25 MCG (1000 UNIT) tablet 5,000 Units  5,000 Units Oral Daily Mansy, Vernetta Honey, MD  5,000 Units at 06/23/23 0922   enoxaparin (LOVENOX) injection 40 mg  40 mg Subcutaneous Q24H Mansy, Jan A, MD   40 mg at 06/23/23 1610   hydrALAZINE (APRESOLINE) injection 10 mg  10 mg Intravenous Q6H PRN Carollee Herter, DO       insulin aspart (novoLOG) injection 0-15 Units  0-15 Units Subcutaneous TID WC Mansy, Jan A, MD   5 Units at 06/23/23 1233   insulin aspart (novoLOG) injection 0-5 Units  0-5 Units Subcutaneous QHS Mansy, Jan A, MD   2 Units at 06/21/23 9604   insulin aspart protamine- aspart (NOVOLOG MIX 70/30) injection 15 Units  15 Units Subcutaneous Once Mansy, Jan A, MD       insulin aspart protamine- aspart (NOVOLOG MIX 70/30) injection 30 Units  30 Units  Subcutaneous Q supper Carollee Herter, DO   30 Units at 06/22/23 1759   insulin aspart protamine- aspart (NOVOLOG MIX 70/30) injection 40 Units  40 Units Subcutaneous Q breakfast Carollee Herter, DO   40 Units at 06/23/23 0840   magnesium hydroxide (MILK OF MAGNESIA) suspension 30 mL  30 mL Oral Daily PRN Mansy, Jan A, MD       magnesium oxide (MAG-OX) tablet 400 mg  400 mg Oral Daily Mansy, Jan A, MD   400 mg at 06/23/23 5409   melatonin tablet 6 mg  6 mg Oral QHS Mansy, Jan A, MD   6 mg at 06/22/23 2118   multivitamin with minerals tablet 1 tablet  1 tablet Oral Daily Mansy, Jan A, MD   1 tablet at 06/23/23 8119   multivitamin-lutein (OCUVITE-LUTEIN) capsule 1 capsule  1 capsule Oral BID Mansy, Jan A, MD   1 capsule at 06/23/23 1478   omega-3 acid ethyl esters (LOVAZA) capsule 1 g  1 g Oral Daily Mansy, Jan A, MD   1 g at 06/23/23 0924   ondansetron (ZOFRAN) tablet 4 mg  4 mg Oral Q6H PRN Mansy, Jan A, MD       Or   ondansetron [facility]) injection 4 mg  4 mg Intravenous Q6H PRN Mansy, Jan A, MD       oxyCODONE (Oxy IR/ROXICODONE) immediate release tablet 5 mg  5 mg Oral Q4H PRN Carollee Herter, DO       Or   oxyCODONE (Oxy IR/ROXICODONE) immediate release tablet 10 mg  10 mg Oral Q4H PRN Carollee Herter, DO       traZODone (DESYREL) tablet 25 mg  25 mg Oral QHS PRN Mansy, Jan A, MD       vancomycin (VANCOCIN) IVPB 1000 mg/200 mL premix  1,000 mg Intravenous Q24H Carollee Herter, DO 200 mL/hr at 06/23/23 0038 1,000 mg at 06/23/23 0038     Discharge Medications: Please see discharge summary for a list of discharge medications.  Relevant Imaging Results:  Relevant Lab Results:   Additional Information [id number]  Catalina Gravel, LCSW

## 2023-06-24 DIAGNOSIS — L02611 Cutaneous abscess of right foot: Secondary | ICD-10-CM | POA: Diagnosis not present

## 2023-06-24 DIAGNOSIS — I10 Essential (primary) hypertension: Secondary | ICD-10-CM | POA: Diagnosis not present

## 2023-06-24 DIAGNOSIS — L03115 Cellulitis of right lower limb: Secondary | ICD-10-CM | POA: Diagnosis not present

## 2023-06-24 DIAGNOSIS — Z0189 Encounter for other specified special examinations: Secondary | ICD-10-CM

## 2023-06-24 DIAGNOSIS — I739 Peripheral vascular disease, unspecified: Secondary | ICD-10-CM | POA: Insufficient documentation

## 2023-06-24 LAB — GLUCOSE, CAPILLARY
Glucose-Capillary: 115 mg/dL — ABNORMAL HIGH (ref 70–99)
Glucose-Capillary: 311 mg/dL — ABNORMAL HIGH (ref 70–99)
Glucose-Capillary: 271 mg/dL — ABNORMAL HIGH (ref 70–99)

## 2023-06-24 LAB — BASIC METABOLIC PANEL
Anion gap: 9 (ref 5–15)
BUN: 11 mg/dL (ref 8–23)
CO2: 23 mmol/L (ref 22–32)
Calcium: 9.2 mg/dL (ref 8.9–10.3)
Chloride: 103 mmol/L (ref 98–111)
Creatinine, Ser: 0.44 mg/dL (ref 0.44–1.00)
GFR, Estimated: >60 mL/min (ref >60)
Glucose, Bld: 107 mg/dL — ABNORMAL HIGH (ref 70–99)
Potassium: 3.7 mmol/L (ref 3.5–5.1)
Sodium: 135 mmol/L (ref 135–145)

## 2023-06-24 LAB — C-REACTIVE PROTEIN: CRP: 7.3 mg/dL — ABNORMAL HIGH (ref <1.0)

## 2023-06-24 LAB — SEDIMENTATION RATE: Sed Rate: 56 mm/h — ABNORMAL HIGH (ref 0–22)

## 2023-06-24 NOTE — Assessment & Plan Note (Signed)
 06-24-2023 patient is alert and oriented x 4.  She knows that she is at [facility].  That she was admitted for right foot cellulitis and abscess.  She knows that it is March 2025.  She knows that President Trump is the current [place] president.  She is oriented to situation.  Had a long discussion with her regarding decision-making capacity in making decisions that can affect her health care.  Discussed at length that just because her family thinks that she is making a bad decision does not mean that she is incompetent to make decisions.  Patient in my medical opinion is fully capable of making her own medical decisions.  I do not think that her healthcare power of attorney has grounds to override her decisions at this time.  I have attempted to rationally explain the reasons why I think she should proceed with nursing home placement temporarily in order to get her to walk better.  She is only ambulating about 15 feet.  She would need to ambulate at least 100 feet in order to be successful in going home.  She states that she is worried about her small dog.  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  She is agreeable to at least looking at facilities but has not yet committed on whether or not she wants to go to a SNF at discharge.  06-25-2023 pt has chosen to go SNF at discharge voluntarily. Pt is competent to make her own medical decisions.

## 2023-06-24 NOTE — Plan of Care (Signed)

## 2023-06-24 NOTE — Progress Notes (Signed)
 PROGRESS NOTE    Jessica Cuevas  WJX:[id number] DOB: [birth year] DOA: 06/20/2023 PCP: Jerl Mina, MD  Subjective: Pt seen and examined.  Had a long discussion with the patient today regarding her decision not to pursue nursing home care.  Patient is alert and orient x 4.  She knows she is at [facility].  That is March 2025.  She was admitted to the hospital due to infection in her right foot.  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  She feels that she would get stuck in a nursing home.  She does not like getting bullied by her children.  She wants to be able to make her own decisions.  In my medical opinion, she is fully competent to make her own medical decisions.  At the end of our conversation, the patient is agreeable to at least starting the nursing home bed search.  She is not yet committed to whether or not she wants to go to a facility but is willing to least look at options.   Hospital Course: HPI: Jessica Cuevas is a 78 y.o. female with medical history significant for type 2 diabetes mellitus, hypertension, dyslipidemia and dementia, who presented to the emergency room with acute onset of worsening right foot swelling that started on its dorsum and was noted to have significant worsening today.  She had any fever or chills.  No nausea or vomiting or abdominal pain.  No chest pain or palpitations.  No cough or wheezing hemoptysis.  He stated that before his cellulitis started he had a podiatry appointment a couple months ago for ingrown toenail and apparently had a scalpel resection of a callus by her podiatrist.  The patient's blood glucose has been dropping lately.  She has been cutting down her long-acting insulin from 75 to 35 units.   Significant Events: Admitted 06/20/2023 for right foot cellulitis   Significant Labs: WBC 9.0, HgB 15.3, plt 229 Na  131, K 4.2, CO2 of 20, BUN 17, Scr 0.59, glu 196  Significant Imaging Studies: Right foot/ankle XR Generalized soft tissue edema. No radiopaque foreign body or soft tissue gas. No radiographic findings of osteomyelitis. 2. No acute fracture or dislocation of the right ankle or foot. 3. 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  Antibiotic Therapy: Anti-infectives (From admission, onward)    Start     Dose/Rate Route Frequency Ordered Stop   06/21/23 0800  ceFEPIme (MAXIPIME) 2 g in sodium chloride 0.9 % 100 mL IVPB        2 g 200 mL/hr over 30 Minutes Intravenous Every 8 hours 06/20/23 2241     06/20/23 2245  vancomycin (VANCOCIN) IVPB 1000 mg/200 mL premix  Status:  Discontinued        1,000 mg 200 mL/hr over 60 Minutes Intravenous  Once 06/20/23 2230 06/20/23 2241   06/20/23 2245  ceFEPIme (MAXIPIME) 2 g in sodium chloride 0.9 % 100 mL IVPB        2 g 200 mL/hr over 30 Minutes Intravenous  Once 06/20/23 2230 06/21/23 0016   06/20/23 2245  vancomycin (VANCOREADY) IVPB 2000 mg/400 mL        2,000 mg 200 mL/hr over 120 Minutes Intravenous  Once 06/20/23 2241 06/21/23 0309       Procedures:   Consultants:     Assessment and Plan: *  Cellulitis of right foot On admission. - The patient be admitted to a medical-surgical bed. - Will continue antibiotic therapy with IV cefepime and vancomycin. - MRI of the right foot is currently pending. - Podiatry consult can be obtained in a.m. - Pain management will be provided. - Warm compresses will be applied.  06-21-2023 Pt states she had a callus shaved off the bottom of her foot by a podiatrist. Does not remember name of podiatrist. MRI right foot shows right foot abscess. I contacted ortho at [facility]. Dr. Lajoyce Corners is out of town this week. Will attempt to locate a podiatrist at [facility] if possible. Continue with IV ABX for now.      06-22-2023 remains on IV Abx. Foot is less swollen and edematous.     06-23-2023  remains on IV Abx. Pt refusing to go to SNF. Wants podiatry referral in [place], [place]. Home health orders placed for PT/RN/nurse aide      06-24-2023 right foot continues to improve.  Still decreasing amounts of erythema.  Much less edema than on admission.  Still having tenderness around the base of the right third and fourth metatarsal head.  Continue with IV antibiotics with cefepime and vancomycin.  Can switch to [facility] and doxycycline at discharge.        Abscess of right foot 06-21-2023 MRI shows right foot abscess. Pt states she had a callus shaved off the bottom of her foot by a podiatrist. Does not remember name of podiatrist. MRI right foot shows right foot abscess. I contacted ortho at [facility] Dr. Lajoyce Corners is out of town this week. Will attempt to locate a podiatrist at [facility] if possible. Continue with IV ABX for now.  06-22-2023 surgery consult reviewed and appreciated. No surgical options offered. Pt with severe PVD. Agree that pt's chances of healing would be quite poor. She will need 2-3 weeks of po abx therapy at home. F/u with outpatient podiatry after discharged.  06-23-2023 continue with IV abx. Pt wants outpatient referral to podiatry in [place]. Referral made in [place] orders to [facility] in [place].  06-24-2023 right foot continues to improve.  Still decreasing amounts of erythema.  Much less edema than on admission.  Still having tenderness around the base of the right third and fourth metatarsal head.  Continue with IV antibiotics with cefepime and vancomycin.  Can switch to [facility] and doxycycline at discharge.  Obesity, Class III, BMI 40-49.9 (morbid obesity) (HCC) Estimated body mass index is 41.57 kg/m as calculated from the following:   Height as of this encounter: 5\' 1"  (1.549 m).   Weight as of this encounter: 99.8 kg.   Essential hypertension On admission. continue antihypertensive therapy.  06-21-2023 hold ACEI while on IV Vanco. Change to po norvasc  10 mg daily for now. Continue coreg 3.125 mg bid.  06-22-2023 on norvasc 10 mg every day and coreg 3.125 mg bid. Can increase coreg to 12.5 mg bid for additional BP control if needed. HR is 75 bpm.  06-24-2023 Coreg was increased to 12.5 mg twice daily.  Continue this for now.  Continue Norvasc 10 mg daily.  Heart rate 80 beats a minute.  Type 2 diabetes mellitus without complication, with long-term current use of insulin (HCC) 06-21-2023 continue with SSI. And 70/30.  06-22-2023 CBGs acceptable  06-23-2023 CBGs acceptable. On 70/30 and SSI.  06-24-2023 stable.  Stable CBGs.  Continue 70/30 and sliding scale insulin.  PVD (peripheral vascular disease) (HCC) 06-24-2023 patient had ABIs performed December 27, 2022.  This showed: Moderate to severe right lower extremity peripheral arterial disease. 2. Moderate left lower extremity peripheral arterial disease. 3. Findings suggest inflow (aortoiliac) occlusive disease.  Patient already on lovaza.  Patient refused to be placed on a statin on PCP office visit 06/2023.  Encounter for competency evaluation 06-24-2023 patient is alert and oriented x 4.  She knows that she is at [facility].  That she was admitted for right foot cellulitis and abscess.  She knows that it is March 2025.  She knows that President Trump is the current [place] president.  She is oriented to situation.  Had a long discussion with her regarding decision-making capacity in making decisions that can affect her health care.  Discussed at length that just because her family thinks that she is making a bad decision does not mean that she is incompetent to make decisions.  Patient in my medical opinion is fully capable of making her own medical decisions.  I do not think that her healthcare power of attorney has grounds to override her decisions at this time.  I have attempted to rationally explain the reasons why I think she should proceed with nursing home placement temporarily in  order to get her to walk better.  She is only ambulating about 15 feet.  She would need to ambulate at least 100 feet in order to be successful in going home.  She states that she is worried about her small dog.  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  She is agreeable to at least looking at facilities but has not yet committed on whether or not she wants to go to a SNF at discharge.  DVT prophylaxis: enoxaparin (LOVENOX) injection 40 mg Start: 06/21/23 1000    Code Status: Full Code Family Communication: Spoke to the daughter Baxter Hire yesterday by phone.  Discussed at length at bedside with the patient regarding decision-making capacity. Disposition Plan: unknown. Home vs snf Reason for continuing need for hospitalization: remains on IV Abxb.  Objective: Vitals:   06/23/23 1433 06/23/23 2029 06/23/23 2356 06/24/23 0559  BP: (!) 141/68 (!) 140/53 (!) 118/47 (!) 150/65  Pulse: 79 71 80 80  Resp: 18 16 16 20   Temp: 98 F (36.7 C) 98.6 F (37 C) 98.6 F (37 C) 98.4 F (36.9 C)  TempSrc: Oral Oral Oral Oral  SpO2: 96% 97% 94% 98%  Weight:      Height:        Intake/Output Summary (Last 24 hours) at 06/24/2023 0930 Last data filed at 06/24/2023 0559 Gross per 24 hour  Intake --  Output 750 ml  Net -750 ml   Filed Weights   06/20/23 1609  Weight: 99.8 kg    Examination:  Physical Exam Vitals and nursing note reviewed.  Constitutional:      General: She is not in acute distress.    Appearance: She is obese. She is not toxic-appearing or diaphoretic.  HENT:     Head: Normocephalic and atraumatic.     Nose: Nose normal.  Eyes:     General: No scleral icterus. Cardiovascular:     Rate and Rhythm: Normal rate and regular rhythm.  Pulmonary:     Effort: Pulmonary effort is normal.     Breath sounds: Normal breath sounds.  Abdominal:     General: Bowel sounds are normal. There is no distension.     Palpations:  Abdomen is  soft.     Tenderness: There is no abdominal tenderness.  Skin:    General: Skin is warm.     Capillary Refill: Capillary refill takes less than 2 seconds.     Findings: Erythema present.     Comments: Less edema of dorsum right foot. Less erythema. Still tender at base of 3rd, 4th right toes on plantar aspect of right foot.  Neurological:     Mental Status: She is alert and oriented to person, place, and time.        Data Reviewed: I have personally reviewed following labs and imaging studies  CBC: Recent Labs  Lab 06/20/23 1726 06/21/23 0420 06/23/23 0501  WBC 9.0 9.2 9.2  NEUTROABS 5.8  --  5.5  HGB 15.3* 13.9 13.7  HCT 45.9 42.6 41.8  MCV 95.2 95.9 95.9  PLT 229 229 248   Basic Metabolic Panel: Recent Labs  Lab 06/20/23 1726 06/21/23 0420 06/22/23 0436 06/23/23 0501 06/24/23 0420  NA 131* 131* 132* 134* 135  K 4.2 4.3 4.3 3.8 3.7  CL 99 102 105 104 103  CO2 20* 20* 19* 21* 23  GLUCOSE 196* 205* 145* 125* 107*  BUN 17 16 13 11 11   CREATININE 0.59 0.47 0.39* 0.39* 0.44  CALCIUM 9.5 9.0 9.1 9.1 9.2   GFR: Estimated Creatinine Clearance: 62.8 mL/min (by C-G formula based on SCr of 0.44 mg/dL). Liver Function Tests: Recent Labs  Lab 06/22/23 0436 06/23/23 0501  AST 23 23  ALT 18 18  ALKPHOS 38 38  BILITOT 0.7 0.6  PROT 7.3 7.1  ALBUMIN 3.1* 3.0*   CBG: Recent Labs  Lab 06/23/23 0731 06/23/23 1158 06/23/23 1626 06/23/23 2031 06/24/23 0721  GLUCAP 136* 234* 249* 237* 115*   Inflammatory Markers: Lab Results  Component Value Date/Time   ESRSEDRATE 56 (H) 06/24/2023 04:20 AM    Scheduled Meds:  amLODipine  10 mg Oral Daily   aspirin  81 mg Oral Daily   carvedilol  12.5 mg Oral BID   cholecalciferol  5,000 Units Oral Daily   enoxaparin (LOVENOX) injection  40 mg Subcutaneous Q24H   insulin aspart  0-15 Units Subcutaneous TID WC   insulin aspart  0-5 Units Subcutaneous QHS   insulin aspart protamine- aspart  15 Units Subcutaneous  Once   insulin aspart protamine- aspart  30 Units Subcutaneous Q supper   insulin aspart protamine- aspart  40 Units Subcutaneous Q breakfast   magnesium oxide  400 mg Oral Daily   melatonin  6 mg Oral QHS   multivitamin with minerals  1 tablet Oral Daily   multivitamin-lutein  1 capsule Oral BID   omega-3 acid ethyl esters  1 g Oral Daily   Continuous Infusions:  ceFEPime (MAXIPIME) IV 2 g (06/24/23 0917)   vancomycin 1,000 mg (06/23/23 2107)     LOS: 4 days   Time spent: 60 minutes  Carollee Herter, DO  [facility]  06/24/2023, 9:30 AM

## 2023-06-24 NOTE — TOC Progression Note (Signed)
 Transition of Care [facility]) - Progression Note    Patient Details  Name: Jessica Cuevas MRN: [id number] Date of Birth: 11-Apr-1945  Transition of Care [facility]) CM/SW Contact  Catalina Gravel, [place] Phone Number: 06/24/2023, 11:59 AM  Clinical Narrative:    CSW visited pt in room she was sitting in her chair, watching her smart phone. We talked at length about SNF and her options- I provided a list of facilities and their reviews, she is willing to consider and [facility] is her first choice, she wants to be in town.   Daughter arrived while we were speaking, daughter expressed she wants mother to get stronger and is worried about her falling and getting hurt. At end of discussion, Pt agreed she will consider PNC at [facility]. TOC to follow.      Barriers to Discharge: Continued Medical Work up  Expected Discharge Plan and Services                                               Social Determinants of Health (SDOH) Interventions SDOH Screenings   Food Insecurity: No Food Insecurity (06/21/2023)  Housing: Low Risk  (06/20/2023)  Transportation Needs: No Transportation Needs (06/20/2023)  Utilities: Not At Risk (06/21/2023)  Depression (PHQ2-9): Low Risk  (08/24/2021)  Social Connections: Unknown (06/21/2023)  Tobacco Use: Low Risk  (06/20/2023)    Readmission Risk Interventions    06/21/2023    7:33 AM  Readmission Risk Prevention Plan  Medication Screening Complete  Transportation Screening Complete

## 2023-06-24 NOTE — Plan of Care (Signed)
   Problem: Education: Goal: Knowledge of General Education information will improve Description Including pain rating scale, medication(s)/side effects and non-pharmacologic comfort measures Outcome: Progressing   Problem: Health Behavior/Discharge Planning: Goal: Ability to manage health-related needs will improve Outcome: Progressing

## 2023-06-24 NOTE — Assessment & Plan Note (Signed)
 06-24-2023 patient had ABIs performed December 27, 2022.  This showed: Moderate to severe right lower extremity peripheral arterial disease. 2. Moderate left lower extremity peripheral arterial disease. 3. Findings suggest inflow (aortoiliac) occlusive disease.  Patient already on lovaza.  Patient refused to be placed on a statin on PCP office visit 06/2023.

## 2023-06-25 DIAGNOSIS — L03115 Cellulitis of right lower limb: Secondary | ICD-10-CM | POA: Diagnosis not present

## 2023-06-25 DIAGNOSIS — L02611 Cutaneous abscess of right foot: Secondary | ICD-10-CM | POA: Diagnosis not present

## 2023-06-25 DIAGNOSIS — I10 Essential (primary) hypertension: Secondary | ICD-10-CM | POA: Diagnosis not present

## 2023-06-25 LAB — CBC WITH DIFFERENTIAL/PLATELET
Abs Immature Granulocytes: 0.07 10*3/uL (ref 0.00–0.07)
Basophils Absolute: 0.1 10*3/uL (ref 0.0–0.1)
Basophils Relative: 1 %
Eosinophils Absolute: 0.2 10*3/uL (ref 0.0–0.5)
Eosinophils Relative: 3 %
HCT: 39.6 % (ref 36.0–46.0)
Hemoglobin: 12.9 g/dL (ref 12.0–15.0)
Immature Granulocytes: 1 %
Lymphocytes Relative: 27 %
Lymphs Abs: 2.4 10*3/uL (ref 0.7–4.0)
MCH: 31.2 pg (ref 26.0–34.0)
MCHC: 32.6 g/dL (ref 30.0–36.0)
MCV: 95.9 fL (ref 80.0–100.0)
Monocytes Absolute: 1 10*3/uL (ref 0.1–1.0)
Monocytes Relative: 11 %
Neutro Abs: 5 10*3/uL (ref 1.7–7.7)
Neutrophils Relative %: 57 %
Platelets: 279 10*3/uL (ref 150–400)
RBC: 4.13 MIL/uL (ref 3.87–5.11)
RDW: 12.7 % (ref 11.5–15.5)
WBC: 8.8 10*3/uL (ref 4.0–10.5)
nRBC: 0 % (ref 0.0–0.2)

## 2023-06-25 LAB — BASIC METABOLIC PANEL
Anion gap: 8 (ref 5–15)
BUN: 13 mg/dL (ref 8–23)
CO2: 23 mmol/L (ref 22–32)
Calcium: 9.1 mg/dL (ref 8.9–10.3)
Chloride: 103 mmol/L (ref 98–111)
Creatinine, Ser: 0.38 mg/dL — ABNORMAL LOW (ref 0.44–1.00)
GFR, Estimated: >60 mL/min (ref >60)
Glucose, Bld: 107 mg/dL — ABNORMAL HIGH (ref 70–99)
Potassium: 3.9 mmol/L (ref 3.5–5.1)
Sodium: 134 mmol/L — ABNORMAL LOW (ref 135–145)

## 2023-06-25 LAB — GLUCOSE, CAPILLARY: Glucose-Capillary: 134 mg/dL — ABNORMAL HIGH (ref 70–99)

## 2023-06-25 MED ORDER — OXYCODONE HCL 5 MG PO TABS: 5.0000 mg | ORAL_TABLET | ORAL | 0 refills | Status: DC | PRN

## 2023-06-25 MED ORDER — ONDANSETRON HCL 4 MG PO TABS: 4.0000 mg | ORAL_TABLET | Freq: Four times a day (QID) | ORAL | Status: AC | PRN

## 2023-06-25 MED ORDER — INSULIN ASPART PROT & ASPART (70-30 MIX) 100 UNIT/ML ~~LOC~~ SUSP: 30.0000 [IU] | Freq: Every day | SUBCUTANEOUS | Status: AC

## 2023-06-25 MED ORDER — DOXYCYCLINE HYCLATE 100 MG PO TABS
100.0000 mg | ORAL_TABLET | Freq: Two times a day (BID) | ORAL | Status: AC
Start: 2023-06-25 — End: 2023-07-09

## 2023-06-25 MED ORDER — CARVEDILOL 6.25 MG PO TABS: 6.2500 mg | ORAL_TABLET | Freq: Two times a day (BID) | ORAL | Status: AC

## 2023-06-25 MED ORDER — INSULIN ASPART 100 UNIT/ML IJ SOLN
0.0000 [IU] | Freq: Three times a day (TID) | INTRAMUSCULAR | Status: DC
Start: 2023-06-25 — End: 2023-06-26

## 2023-06-25 MED ORDER — INSULIN ASPART PROT & ASPART (70-30 MIX) 100 UNIT/ML ~~LOC~~ SUSP
40.0000 [IU] | Freq: Every day | SUBCUTANEOUS | Status: AC
Start: 2023-06-25 — End: ?

## 2023-06-25 MED ORDER — METRONIDAZOLE 500 MG PO TABS: 500.0000 mg | ORAL_TABLET | Freq: Two times a day (BID) | ORAL | Status: AC

## 2023-06-25 MED ORDER — CEFADROXIL 500 MG PO CAPS: 500.0000 mg | ORAL_CAPSULE | Freq: Two times a day (BID) | ORAL | Status: AC

## 2023-06-25 MED ORDER — ACETAMINOPHEN 500 MG PO TABS: 1000.0000 mg | ORAL_TABLET | Freq: Four times a day (QID) | ORAL | Status: AC | PRN

## 2023-06-25 NOTE — TOC Transition Note (Addendum)
 Transition of Care [facility]) - Discharge Note   Patient Details  Name: Jessica Cuevas MRN: [id number] Date of Birth: Feb 08, 1946  Transition of Care [facility]) CM/SW Contact:  Isabella Bowens, LCSWA Phone Number: 06/25/2023, 9:52 AM   Clinical Narrative:     CSW spoke with patient this morning and shared with her that [facility] offered her a bed. Patient agreeable and accepted offer. CSW did call daughter who is POA and had to leave a confidential voicemail for a return call back. Nurse provided with room number ( 134) and number to call report [phone]. TOC signing off.   Patient daughter Belenda Cruise called this Clinical research associate back . Daughter is also agreeable to patient going to [facility] for ST rehab.   Final next level of care: Skilled Nursing Facility Barriers to Discharge: Barriers Resolved   Patient Goals and CMS Choice Patient states their goals for this hospitalization and ongoing recovery are:: DC to SNF for ST rehab CMS Medicare.gov Compare Post Acute Care list provided to:: Patient Choice offered to / list presented to : Patient [place] ownership interest in [facility].provided to:: Patient    Discharge Placement              Patient chooses bed at:  [facility]) Patient to be transferred to facility by: Staff Name of family member notified: [name] ( patient ) Patient and family notified of of transfer: 06/25/23  Discharge Plan and Services Additional resources added to the After Visit Summary for                                       Social Drivers of Health (SDOH) Interventions SDOH Screenings   Food Insecurity: No Food Insecurity (06/21/2023)  Housing: Low Risk  (06/20/2023)  Transportation Needs: No Transportation Needs (06/20/2023)  Utilities: Not At Risk (06/21/2023)  Depression (PHQ2-9): Low Risk  (08/24/2021)  Social Connections: Unknown (06/21/2023)  Tobacco Use: Low Risk  (06/20/2023)     Readmission Risk Interventions     06/25/2023    9:49 AM 06/21/2023    7:33 AM  Readmission Risk Prevention Plan  Medication Screening Complete Complete  Transportation Screening Complete Complete

## 2023-06-25 NOTE — Progress Notes (Signed)
 Report called to Idell Pickles , RN at [facility], patient going to room 134. Will transport asap. Hard scripts and follow up instructions in discharge packet

## 2023-06-25 NOTE — Plan of Care (Signed)

## 2023-06-25 NOTE — Discharge Summary (Signed)
 [facility] Discharge Summary   Patient name: Jessica Cuevas  Admit date:     06/20/2023  Discharge date: 06/25/2023  Attending Physician: Hannah Beat [[id number]]  Discharge Physician: Carollee Herter   PCP: Jerl Mina, MD  Admitted From: Home  Disposition:   [facility]  SNF  Recommendations for Outpatient Follow-up:  Follow up with PCP in 1-2 weeks Ambulatory referral made to [facility] in [place], [place]   Home Health:No Equipment/Devices: None    Discharge Condition:Stable CODE STATUS:FULL Diet recommendation: Diabetic Fluid Restriction: None  Hospital Summary: HPI: Jessica Cuevas is a 78 y.o. female with medical history significant for type 2 diabetes mellitus, hypertension, dyslipidemia and dementia, who presented to the emergency room with acute onset of worsening right foot swelling that started on its dorsum and was noted to have significant worsening today.  She had any fever or chills.  No nausea or vomiting or abdominal pain.  No chest pain or palpitations.  No cough or wheezing hemoptysis.  He stated that before his cellulitis started he had a podiatry appointment a couple months ago for ingrown toenail and apparently had a scalpel resection of a callus by her podiatrist.  The patient's blood glucose has been dropping lately.  She has been cutting down her long-acting insulin from 75 to 35 units.   Significant Events: Admitted 06/20/2023 for right foot cellulitis   Significant Labs: WBC 9.0, HgB 15.3, plt 229 Na 131, K 4.2, CO2 of 20, BUN 17, Scr 0.59, glu 196  Significant Imaging Studies: Right foot/ankle XR Generalized soft tissue edema. No radiopaque foreign body or soft tissue gas. No radiographic findings of osteomyelitis. 2. No acute fracture or dislocation of the right ankle or foot. 3. 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  Antibiotic Therapy: Anti-infectives (From admission, onward)     Start     Dose/Rate Route Frequency Ordered Stop   06/21/23 0800  ceFEPIme (MAXIPIME) 2 g in sodium chloride 0.9 % 100 mL IVPB        2 g 200 mL/hr over 30 Minutes Intravenous Every 8 hours 06/20/23 2241     06/20/23 2245  vancomycin (VANCOCIN) IVPB 1000 mg/200 mL premix  Status:  Discontinued        1,000 mg 200 mL/hr over 60 Minutes Intravenous  Once 06/20/23 2230 06/20/23 2241   06/20/23 2245  ceFEPIme (MAXIPIME) 2 g in sodium chloride 0.9 % 100 mL IVPB        2 g 200 mL/hr over 30 Minutes Intravenous  Once 06/20/23 2230 06/21/23 0016   06/20/23 2245  vancomycin (VANCOREADY) IVPB 2000 mg/400 mL        2,000 mg 200 mL/hr over 120 Minutes Intravenous  Once 06/20/23 2241 06/21/23 0309       Procedures:   Consultants:    Hospital Course by Problem: * Cellulitis of right foot On admission. - The patient be admitted to a medical-surgical bed. - Will continue antibiotic therapy with IV cefepime and vancomycin. - MRI of the right foot is currently pending. - Podiatry consult can be obtained in a.m. - Pain management will be provided. - Warm compresses will be applied.  06-21-2023 Pt states she had a callus shaved off the bottom of her foot by a podiatrist. Does not remember name of podiatrist. MRI right foot shows right foot abscess. I contacted ortho at [facility]. Dr. Lajoyce Corners is out of town this week. Will attempt to locate a podiatrist  at [facility] if possible. Continue with IV ABX for now.      06-22-2023 remains on IV Abx. Foot is less swollen and edematous.     06-23-2023 remains on IV Abx. Pt refusing to go to SNF. Wants podiatry referral in [place], [place]. Home health orders placed for PT/RN/nurse aide      06-24-2023 right foot continues to improve.  Still decreasing amounts of erythema.  Much less edema than on admission.  Still having tenderness around the base of the right third and fourth metatarsal head.  Continue with IV antibiotics with cefepime and vancomycin.  Can switch to [facility]  and doxycycline at discharge.      06-25-2023 pt has SNF bed available today. Will continue po duricef/doxy/flagyl for 2 weeks.  06-20-2023    06-21-2023    06-22-2023    06-23-2023    06-24-2023    06-25-2023           Abscess of right foot 06-21-2023 MRI shows right foot abscess. Pt states she had a callus shaved off the bottom of her foot by a podiatrist. Does not remember name of podiatrist. MRI right foot shows right foot abscess. I contacted ortho at [facility]. Dr. Lajoyce Corners is out of town this week. Will attempt to locate a podiatrist at [facility] if possible. Continue with IV ABX for now.  06-22-2023 surgery consult reviewed and appreciated. No surgical options offered. Pt with severe PVD. Agree that pt's chances of healing would be quite poor. She will need 2-3 weeks of po abx therapy at home. F/u with outpatient podiatry after discharged.  06-23-2023 continue with IV abx. Pt wants outpatient referral to podiatry in [place]. Referral made in [place] orders to [facility] in [place].  06-24-2023 right foot continues to improve.  Still decreasing amounts of erythema.  Much less edema than on admission.  Still having tenderness around the base of the right third and fourth metatarsal head.  Continue with IV antibiotics with cefepime and vancomycin.  Can switch to [facility] and doxycycline at discharge.  06-25-2023 surgery intervention on right foot abscess contraindicated due to poor peripheral circulation. Continue with po abx for 2-3 weeks. Ambulatory referral to [facility] in [place].  Obesity, Class III, BMI 40-49.9 (morbid obesity) (HCC) Body mass index is 41.57 kg/m.   Essential hypertension On admission. continue antihypertensive therapy.  06-21-2023 hold ACEI while on IV Vanco. Change to po norvasc 10 mg daily for now. Continue coreg 3.125 mg bid.  06-22-2023 on norvasc 10 mg every day and coreg 3.125 mg bid. Can increase coreg to 12.5 mg bid for additional BP  control if needed. HR is 75 bpm.  06-24-2023 Coreg was increased to 12.5 mg twice daily.  Continue this for now.  Continue Norvasc 10 mg daily.  Heart rate 80 beats a minute.  06-25-2023 resume ACEI at discharge. Lisinopril 10 mg qday and coreg 6.125 mg bid.  Type 2 diabetes mellitus without complication, with long-term current use of insulin (HCC) 06-21-2023 continue with SSI. And 70/30.  06-22-2023 CBGs acceptable  06-23-2023 CBGs acceptable. On 70/30 and SSI.  06-24-2023 stable.  Stable CBGs.  Continue 70/30 and sliding scale insulin.  03-17-20225 DC to SNF on 70/30. 40 u qam with breakfast, 30 units qpm with dinner. Add SSI.  PVD (peripheral vascular disease) (HCC) 06-24-2023 patient had ABIs performed December 27, 2022.  This showed: Moderate to severe right lower extremity peripheral arterial disease. 2. Moderate left lower extremity peripheral arterial disease.  3. Findings suggest inflow (aortoiliac) occlusive disease.  Patient already on lovaza.  Patient refused to be placed on a statin on PCP office visit 06/2023.  Encounter for competency evaluation 06-24-2023 patient is alert and oriented x 4.  She knows that she is at [facility].  That she was admitted for right foot cellulitis and abscess.  She knows that it is March 2025.  She knows that President Trump is the current [place] president.  She is oriented to situation.  Had a long discussion with her regarding decision-making capacity in making decisions that can affect her health care.  Discussed at length that just because her family thinks that she is making a bad decision does not mean that she is incompetent to make decisions.  Patient in my medical opinion is fully capable of making her own medical decisions.  I do not think that her healthcare power of attorney has grounds to override her decisions at this time.  I have attempted to rationally explain the reasons why I think she should proceed with nursing home placement  temporarily in order to get her to walk better.  She is only ambulating about 15 feet.  She would need to ambulate at least 100 feet in order to be successful in going home.  She states that she is worried about her small dog.  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  She is agreeable to at least looking at facilities but has not yet committed on whether or not she wants to go to a SNF at discharge.  06-25-2023 pt has chosen to go SNF at discharge voluntarily. Pt is competent to make her own medical decisions.    Discharge Diagnoses:  Principal Problem:   Cellulitis of right foot Active Problems:   Abscess of right foot   Type 2 diabetes mellitus without complication, with long-term current use of insulin (HCC)   Essential hypertension   Obesity, Class III, BMI 40-49.9 (morbid obesity) (HCC)   Encounter for competency evaluation   PVD (peripheral vascular disease) [facility])   Discharge Instructions  Discharge Instructions     Ambulatory referral to Podiatry   Complete by: As directed    Right foot cellulitis and plantar abscess   Call MD for:  difficulty breathing, headache or visual disturbances   Complete by: As directed    Call MD for:  extreme fatigue   Complete by: As directed    Call MD for:  hives   Complete by: As directed    Call MD for:  persistant dizziness or light-headedness   Complete by: As directed    Call MD for:  persistant nausea and vomiting   Complete by: As directed    Call MD for:  redness, tenderness, or signs of infection (pain, swelling, redness, odor or green/yellow discharge around incision site)   Complete by: As directed    Call MD for:  severe uncontrolled pain   Complete by: As directed    Call MD for:  temperature >100.4   Complete by: As directed    Diet - low sodium heart healthy   Complete by: As directed    Diet Carb Modified   Complete by: As directed    Discharge instructions    Complete by: As directed    1. Follow up with your primary care provider in 1-2 weeks following discharge from hospital. 2. Outpatient referral made to podiatry in [place], [place].  Increase activity slowly   Complete by: As directed    No wound care   Complete by: As directed       Allergies as of 06/25/2023       Reactions   Aloe Rash   Metformin And Related    Had lactic acidosis   Penicillins Rash   Tape Rash        Medication List     STOP taking these medications    insulin NPH-regular Human (70-30) 100 UNIT/ML injection   sulfamethoxazole-trimethoprim 800-160 MG tablet Commonly known as: BACTRIM DS       TAKE these medications    acetaminophen 500 MG tablet Commonly known as: TYLENOL Take 2 tablets (1,000 mg total) by mouth every 6 (six) hours as needed for mild pain (pain score 1-3), fever or headache (or Fever >/= 101).   alendronate 70 MG tablet Commonly known as: FOSAMAX Take 70 mg by mouth once a week.   aspirin 81 MG chewable tablet Chew 81 mg by mouth daily.   carvedilol 6.25 MG tablet Commonly known as: COREG Take 1 tablet (6.25 mg total) by mouth 2 (two) times daily. What changed:  medication strength how much to take   cefadroxil 500 MG capsule Commonly known as: DURICEF Take 1 capsule (500 mg total) by mouth 2 (two) times daily for 14 days.   Coenzyme Q-10 200 MG Caps Take 1 capsule by mouth daily.   doxycycline 100 MG tablet Commonly known as: VIBRA-TABS Take 1 tablet (100 mg total) by mouth 2 (two) times daily for 14 days.   Fish Oil 1000 MG Caps Take 1 capsule by mouth daily.   insulin aspart 100 UNIT/ML injection Commonly known as: novoLOG Inject 0-15 Units into the skin 3 (three) times daily with meals. CBG 70 - 120: 0 units CBG 121 - 150: 2 units CBG 151 - 200: 3 units CBG 201 - 250: 5 units CBG 251 - 300: 8 units CBG 301 - 350: 11 units CBG 351 - 400: 15 units   insulin aspart protamine- aspart (70-30) 100  UNIT/ML injection Commonly known as: NOVOLOG MIX 70/30 Inject 0.3 mLs (30 Units total) into the skin daily with supper.   insulin aspart protamine- aspart (70-30) 100 UNIT/ML injection Commonly known as: NOVOLOG MIX 70/30 Inject 0.4 mLs (40 Units total) into the skin daily with breakfast.   lisinopril 10 MG tablet Commonly known as: ZESTRIL Take 1 tablet by mouth daily.   Magnesium Oxide (Antacid) 500 MG Caps Take 1 capsule by mouth daily.   metroNIDAZOLE 500 MG tablet Commonly known as: Flagyl Take 1 tablet (500 mg total) by mouth 2 (two) times daily for 14 days.   multivitamin with minerals Tabs tablet Take 1 tablet by mouth daily.   ondansetron 4 MG tablet Commonly known as: ZOFRAN Take 1 tablet (4 mg total) by mouth every 6 (six) hours as needed for nausea.   oxyCODONE 5 MG immediate release tablet Commonly known as: Oxy IR/ROXICODONE Take 1 tablet (5 mg total) by mouth every 4 (four) hours as needed for moderate pain (pain score 4-6).   PRESERVISION AREDS 2+MULTI VIT PO Take 1 capsule by mouth 2 (two) times daily.   Vitamin D3 125 MCG (5000 UT) Tabs Take 5,000 Units by mouth daily.        Allergies  Allergen Reactions   Aloe Rash   Metformin And Related     Had lactic acidosis   Penicillins Rash   Tape Rash  Discharge Exam: Vitals:   06/24/23 1955 06/25/23 0425  BP: (!) 129/54 (!) 148/65  Pulse: 71 80  Resp: 20 20  Temp: 98.6 F (37 C) 98.8 F (37.1 C)  SpO2: 97% 96%    Physical Exam Vitals and nursing note reviewed.  Constitutional:      Appearance: She is obese.  HENT:     Head: Normocephalic and atraumatic.     Nose: Nose normal.  Eyes:     General: No scleral icterus. Cardiovascular:     Rate and Rhythm: Normal rate and regular rhythm.  Pulmonary:     Effort: Pulmonary effort is normal.  Abdominal:     General: Bowel sounds are normal. There is no distension.     Palpations: Abdomen is soft.     Tenderness: There is no abdominal  tenderness. There is no guarding.  Skin:    General: Skin is warm and dry.     Capillary Refill: Capillary refill takes less than 2 seconds.     Findings: Erythema present.     Comments: See picture of right foot  Neurological:     General: No focal deficit present.     Mental Status: She is alert and oriented to person, place, and time.        The results of significant diagnostics from this hospitalization (including imaging, microbiology, ancillary and laboratory) are listed below for reference.     Labs: Basic Metabolic Panel: Recent Labs  Lab 06/21/23 0420 06/22/23 0436 06/23/23 0501 06/24/23 0420 06/25/23 0418  NA 131* 132* 134* 135 134*  K 4.3 4.3 3.8 3.7 3.9  CL 102 105 104 103 103  CO2 20* 19* 21* 23 23  GLUCOSE 205* 145* 125* 107* 107*  BUN 16 13 11 11 13   CREATININE 0.47 0.39* 0.39* 0.44 0.38*  CALCIUM 9.0 9.1 9.1 9.2 9.1   Liver Function Tests: Recent Labs  Lab 06/22/23 0436 06/23/23 0501  AST 23 23  ALT 18 18  ALKPHOS 38 38  BILITOT 0.7 0.6  PROT 7.3 7.1  ALBUMIN 3.1* 3.0*   CBC: Recent Labs  Lab 06/20/23 1726 06/21/23 0420 06/23/23 0501 06/25/23 0418  WBC 9.0 9.2 9.2 8.8  NEUTROABS 5.8  --  5.5 5.0  HGB 15.3* 13.9 13.7 12.9  HCT 45.9 42.6 41.8 39.6  MCV 95.2 95.9 95.9 95.9  PLT 229 229 248 279   CBG: Recent Labs  Lab 06/23/23 2031 06/24/23 0721 06/24/23 1538 06/24/23 2121 06/25/23 0733  GLUCAP 237* 115* 311* 271* 134*   Sepsis Labs Recent Labs  Lab 06/20/23 1726 06/21/23 0420 06/23/23 0501 06/25/23 0418  WBC 9.0 9.2 9.2 8.8   Inflammatory Markers: Lab Results  Component Value Date/Time   ESRSEDRATE 56 (H) 06/24/2023 04:20 AM   CRP 7.3 (H) 06/24/2023 04:20 AM   Procedures/Studies: MR FOOT RIGHT W WO CONTRAST Result Date: 06/21/2023 CLINICAL DATA:  Foot swelling, diabetic, osteomyelitis suspected, xray done EXAM: MRI OF THE RIGHT FOREFOOT WITHOUT AND WITH CONTRAST TECHNIQUE: Multiplanar, multisequence MR imaging of  the right forefoot was performed before and after the administration of intravenous contrast. CONTRAST:  10mL GADAVIST GADOBUTROL 1 MMOL/ML IV SOLN COMPARISON:  X-ray 06/20/2023 FINDINGS: Technical Note: Despite efforts by the technologist and patient, motion artifact is present on today's exam and could not be eliminated. This reduces exam sensitivity and specificity. Bones/Joint/Cartilage No acute fracture. No dislocation. Joint spaces are maintained. Minimal degenerative changes, most notably at the first MTP joint. No joint effusion or erosion. No  bone marrow edema or periostitis. No marrow replacing bone lesion. Ligaments Intact Lisfranc ligament.  Intact collateral ligaments. Muscles and Tendons Diffuse edema-like signal of the foot musculature which may represent changes related to denervation and/or myositis. Intact flexor and extensor tendons without tendinosis, tear, or tenosynovitis. Soft tissues Generalized soft tissue edema throughout the forefoot. Fluid collection within the plantar soft tissues of the distal forefoot underlying the third and fourth toes measuring approximately 3.0 x 1.3 x 1.3 cm (series 7, image 12). No well-defined peripheral enhancement. No discernible wound or ulceration. IMPRESSION: 1. No evidence of osteomyelitis of the right forefoot. 2. Diffuse soft tissue edema with fluid collection within the plantar soft tissues of the distal forefoot underlying the third and fourth toes measuring approximately 3.0 x 1.3 x 1.3 cm. No well-defined peripheral enhancement. Appearance favors phlegmon/developing abscess. 3. Diffuse edema-like signal of the foot musculature which may represent changes related to denervation and/or myositis. Electronically Signed   By: Duanne Guess D.O.   On: 06/21/2023 11:30   DG Foot Complete Right Result Date: 06/20/2023 CLINICAL DATA:  Provided history: Trauma, cellulitis. EXAM: RIGHT FOOT COMPLETE - 3+ VIEW; RIGHT ANKLE - COMPLETE 3+ VIEW COMPARISON:   None Available. FINDINGS: Ankle: No fracture or dislocation. The ankle mortise is preserved. No erosions or bony destructive change. No convincing ankle joint effusion. Generalized soft tissue edema. No radiopaque foreign body or soft tissue gas vascular calcifications are seen. Foot: No acute fracture or dislocation. Flattening of the second metatarsal head. Mild midfoot degenerative dorsal spurring. No erosions or bony destructive change. Generalized soft tissue edema. No radiopaque foreign body or soft tissue gas. IMPRESSION: 1. Generalized soft tissue edema. No radiopaque foreign body or soft tissue gas. No radiographic findings of osteomyelitis. 2. No acute fracture or dislocation of the right ankle or foot. 3. 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 Electronically Signed   By: Narda Rutherford M.D.   On: 06/20/2023 19:30   DG Ankle Complete Right Result Date: 06/20/2023 CLINICAL DATA:  Provided history: Trauma, cellulitis. EXAM: RIGHT FOOT COMPLETE - 3+ VIEW; RIGHT ANKLE - COMPLETE 3+ VIEW COMPARISON:  None Available. FINDINGS: Ankle: No fracture or dislocation. The ankle mortise is preserved. No erosions or bony destructive change. No convincing ankle joint effusion. Generalized soft tissue edema. No radiopaque foreign body or soft tissue gas vascular calcifications are seen. Foot: No acute fracture or dislocation. Flattening of the second metatarsal head. Mild midfoot degenerative dorsal spurring. No erosions or bony destructive change. Generalized soft tissue edema. No radiopaque foreign body or soft tissue gas. IMPRESSION: 1. Generalized soft tissue edema. No radiopaque foreign body or soft tissue gas. No radiographic findings of osteomyelitis. 2. No acute fracture or dislocation of the right ankle or foot. 3. 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 Electronically Signed   By: Narda Rutherford M.D.   On: 06/20/2023 19:30     Time coordinating discharge: 50 mins  SIGNED:  Carollee Herter, DO [facility] 06/25/23, 9:16 AM

## 2023-06-25 NOTE — Progress Notes (Signed)
 PROGRESS NOTE    Jessica Cuevas  RCV:[id number] DOB: October 26, 1945 DOA: 06/20/2023 PCP: Jerl Mina, MD  Subjective: Pt seen and examined.  Has bed offer at [facility] center. Pt has accepted bed offer. Does not need insurance authorization per CM. Ready for DC.   Hospital Course: HPI: Jessica Cuevas is a 78 y.o. female with medical history significant for type 2 diabetes mellitus, hypertension, dyslipidemia and dementia, who presented to the emergency room with acute onset of worsening right foot swelling that started on its dorsum and was noted to have significant worsening today.  She had any fever or chills.  No nausea or vomiting or abdominal pain.  No chest pain or palpitations.  No cough or wheezing hemoptysis.  He stated that before his cellulitis started he had a podiatry appointment a couple months ago for ingrown toenail and apparently had a scalpel resection of a callus by her podiatrist.  The patient's blood glucose has been dropping lately.  She has been cutting down her long-acting insulin from 75 to 35 units.   Significant Events: Admitted 06/20/2023 for right foot cellulitis   Significant Labs: WBC 9.0, HgB 15.3, plt 229 Na 131, K 4.2, CO2 of 20, BUN 17, Scr 0.59, glu 196  Significant Imaging Studies: Right foot/ankle XR Generalized soft tissue edema. No radiopaque foreign body or soft tissue gas. No radiographic findings of osteomyelitis. 2. No acute fracture or dislocation of the right ankle or foot. 3. 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  Antibiotic Therapy: Anti-infectives (From admission, onward)    Start     Dose/Rate Route Frequency Ordered Stop   06/21/23 0800  ceFEPIme (MAXIPIME) 2 g in sodium chloride 0.9 % 100 mL IVPB        2 g 200 mL/hr over 30 Minutes Intravenous Every 8 hours 06/20/23 2241     06/20/23 2245  vancomycin (VANCOCIN) IVPB 1000 mg/200 mL premix  Status:  Discontinued        1,000 mg 200 mL/hr  over 60 Minutes Intravenous  Once 06/20/23 2230 06/20/23 2241   06/20/23 2245  ceFEPIme (MAXIPIME) 2 g in sodium chloride 0.9 % 100 mL IVPB        2 g 200 mL/hr over 30 Minutes Intravenous  Once 06/20/23 2230 06/21/23 0016   06/20/23 2245  vancomycin (VANCOREADY) IVPB 2000 mg/400 mL        2,000 mg 200 mL/hr over 120 Minutes Intravenous  Once 06/20/23 2241 06/21/23 0309       Procedures:   Consultants:     Assessment and Plan: * Cellulitis of right foot On admission. - The patient be admitted to a medical-surgical bed. - Will continue antibiotic therapy with IV cefepime and vancomycin. - MRI of the right foot is currently pending. - Podiatry consult can be obtained in a.m. - Pain management will be provided. - Warm compresses will be applied.  06-21-2023 Pt states she had a callus shaved off the bottom of her foot by a podiatrist. Does not remember name of podiatrist. MRI right foot shows right foot abscess. I contacted ortho at [facility]. Dr. Lajoyce Corners is out of town this week. Will attempt to locate a podiatrist at [facility] if possible. Continue with IV ABX for now.      06-22-2023 remains on IV Abx. Foot is less swollen and edematous.     06-23-2023 remains on IV Abx. Pt refusing to go to SNF. Wants podiatry referral in [place], [place]. Home health  orders placed for PT/RN/nurse aide      06-24-2023 right foot continues to improve.  Still decreasing amounts of erythema.  Much less edema than on admission.  Still having tenderness around the base of the right third and fourth metatarsal head.  Continue with IV antibiotics with cefepime and vancomycin.  Can switch to [facility] and doxycycline at discharge.      06-25-2023 pt has SNF bed available today. Will continue po duricef/doxy/flagyl for 2 weeks.  06-20-2023    06-21-2023    06-22-2023    06-23-2023    06-24-2023    06-25-2023           Abscess of right foot 06-21-2023 MRI shows right foot abscess. Pt states she had a callus shaved  off the bottom of her foot by a podiatrist. Does not remember name of podiatrist. MRI right foot shows right foot abscess. I contacted ortho at [facility] Dr. Lajoyce Corners is out of town this week. Will attempt to locate a podiatrist at [facility] if possible. Continue with IV ABX for now.  06-22-2023 surgery consult reviewed and appreciated. No surgical options offered. Pt with severe PVD. Agree that pt's chances of healing would be quite poor. She will need 2-3 weeks of po abx therapy at home. F/u with outpatient podiatry after discharged.  06-23-2023 continue with IV abx. Pt wants outpatient referral to podiatry in [place]. Referral made in DC orders to [facility] in [place].  06-24-2023 right foot continues to improve.  Still decreasing amounts of erythema.  Much less edema than on admission.  Still having tenderness around the base of the right third and fourth metatarsal head.  Continue with IV antibiotics with cefepime and vancomycin.  Can switch to [facility] and doxycycline at discharge.  06-25-2023 surgery intervention on right foot abscess contraindicated due to poor peripheral circulation. Continue with po abx for 2-3 weeks. Ambulatory referral to [facility] in [place].  Obesity, Class III, BMI 40-49.9 (morbid obesity) (HCC) Body mass index is 41.57 kg/m.   Essential hypertension On admission. continue antihypertensive therapy.  06-21-2023 hold ACEI while on IV Vanco. Change to po norvasc 10 mg daily for now. Continue coreg 3.125 mg bid.  06-22-2023 on norvasc 10 mg every day and coreg 3.125 mg bid. Can increase coreg to 12.5 mg bid for additional BP control if needed. HR is 75 bpm.  06-24-2023 Coreg was increased to 12.5 mg twice daily.  Continue this for now.  Continue Norvasc 10 mg daily.  Heart rate 80 beats a minute.  06-25-2023 resume ACEI at discharge. Lisinopril 10 mg qday and coreg 6.125 mg bid.  Type 2 diabetes mellitus without complication, with long-term current  use of insulin (HCC) 06-21-2023 continue with SSI. And 70/30.  06-22-2023 CBGs acceptable  06-23-2023 CBGs acceptable. On 70/30 and SSI.  06-24-2023 stable.  Stable CBGs.  Continue 70/30 and sliding scale insulin.  03-17-20225 DC to SNF on 70/30. 40 u qam with breakfast, 30 units qpm with dinner. Add SSI.  PVD (peripheral vascular disease) (HCC) 06-24-2023 patient had ABIs performed December 27, 2022.  This showed: Moderate to severe right lower extremity peripheral arterial disease. 2. Moderate left lower extremity peripheral arterial disease. 3. Findings suggest inflow (aortoiliac) occlusive disease.  Patient already on lovaza.  Patient refused to be placed on a statin on PCP office visit 06/2023.  Encounter for competency evaluation 06-24-2023 patient is alert and oriented x 4.  She knows that she is at [facility].  That she was admitted for right foot cellulitis and abscess.  She knows that it is March 2025.  She knows that President Trump is the current [place] president.  She is oriented to situation.  Had a long discussion with her regarding decision-making capacity in making decisions that can affect her health care.  Discussed at length that just because her family thinks that she is making a bad decision does not mean that she is incompetent to make decisions.  Patient in my medical opinion is fully capable of making her own medical decisions.  I do not think that her healthcare power of attorney has grounds to override her decisions at this time.  I have attempted to rationally explain the reasons why I think she should proceed with nursing home placement temporarily in order to get her to walk better.  She is only ambulating about 15 feet.  She would need to ambulate at least 100 feet in order to be successful in going home.  She states that she is worried about her small dog.  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  She  is agreeable to at least looking at facilities but has not yet committed on whether or not she wants to go to a SNF at discharge.  06-25-2023 pt has chosen to go SNF at discharge voluntarily. Pt is competent to make her own medical decisions.      DVT prophylaxis: enoxaparin (LOVENOX) injection 40 mg Start: 06/21/23 1000    Code Status: Full Code Family Communication: no family at bedside. Pt is decisional Disposition Plan: SNF Reason for continuing need for hospitalization: medically stable for DC.  Objective: Vitals:   06/24/23 0559 06/24/23 1315 06/24/23 1955 06/25/23 0425  BP: (!) 150/65 120/67 (!) 129/54 (!) 148/65  Pulse: 80 77 71 80  Resp: 20 18 20 20   Temp: 98.4 F (36.9 C) 97.6 F (36.4 C) 98.6 F (37 C) 98.8 F (37.1 C)  TempSrc: Oral Oral Oral Oral  SpO2: 98% 96% 97% 96%  Weight:      Height:        Intake/Output Summary (Last 24 hours) at 06/25/2023 0902 Last data filed at 06/24/2023 1755 Gross per 24 hour  Intake 420 ml  Output --  Net 420 ml   Filed Weights   06/20/23 1609  Weight: 99.8 kg    Examination:  Physical Exam Vitals and nursing note reviewed.  Constitutional:      Appearance: She is obese.  HENT:     Head: Normocephalic and atraumatic.     Nose: Nose normal.  Eyes:     General: No scleral icterus. Cardiovascular:     Rate and Rhythm: Normal rate and regular rhythm.  Pulmonary:     Effort: Pulmonary effort is normal.  Abdominal:     General: Bowel sounds are normal. There is no distension.     Palpations: Abdomen is soft.     Tenderness: There is no abdominal tenderness. There is no guarding.  Skin:    General: Skin is warm and dry.     Capillary Refill: Capillary refill takes less than 2 seconds.     Findings: Erythema present.     Comments: See picture of right foot  Neurological:     General: No focal deficit present.     Mental Status: She is alert and oriented to person, place, and time.        Data  Reviewed: I  have personally reviewed following labs and imaging studies  CBC: Recent Labs  Lab 06/20/23 1726 06/21/23 0420 06/23/23 0501 06/25/23 0418  WBC 9.0 9.2 9.2 8.8  NEUTROABS 5.8  --  5.5 5.0  HGB 15.3* 13.9 13.7 12.9  HCT 45.9 42.6 41.8 39.6  MCV 95.2 95.9 95.9 95.9  PLT 229 229 248 279   Basic Metabolic Panel: Recent Labs  Lab 06/21/23 0420 06/22/23 0436 06/23/23 0501 06/24/23 0420 06/25/23 0418  NA 131* 132* 134* 135 134*  K 4.3 4.3 3.8 3.7 3.9  CL 102 105 104 103 103  CO2 20* 19* 21* 23 23  GLUCOSE 205* 145* 125* 107* 107*  BUN 16 13 11 11 13   CREATININE 0.47 0.39* 0.39* 0.44 0.38*  CALCIUM 9.0 9.1 9.1 9.2 9.1   GFR: Estimated Creatinine Clearance: 62.8 mL/min (A) (by C-G formula based on SCr of 0.38 mg/dL (L)). Liver Function Tests: Recent Labs  Lab 06/22/23 0436 06/23/23 0501  AST 23 23  ALT 18 18  ALKPHOS 38 38  BILITOT 0.7 0.6  PROT 7.3 7.1  ALBUMIN 3.1* 3.0*    Recent Labs  Lab 06/23/23 2031 06/24/23 0721 06/24/23 1538 06/24/23 2121 06/25/23 0733  GLUCAP 237* 115* 311* 271* 134*   Scheduled Meds:  amLODipine  10 mg Oral Daily   aspirin  81 mg Oral Daily   carvedilol  12.5 mg Oral BID   cholecalciferol  5,000 Units Oral Daily   enoxaparin (LOVENOX) injection  40 mg Subcutaneous Q24H   insulin aspart  0-15 Units Subcutaneous TID WC   insulin aspart  0-5 Units Subcutaneous QHS   insulin aspart protamine- aspart  15 Units Subcutaneous Once   insulin aspart protamine- aspart  30 Units Subcutaneous Q supper   insulin aspart protamine- aspart  40 Units Subcutaneous Q breakfast   magnesium oxide  400 mg Oral Daily   melatonin  6 mg Oral QHS   multivitamin with minerals  1 tablet Oral Daily   multivitamin-lutein  1 capsule Oral BID   omega-3 acid ethyl esters  1 g Oral Daily   Continuous Infusions:  ceFEPime (MAXIPIME) IV 2 g (06/25/23 0021)   vancomycin 1,000 mg (06/24/23 2118)     LOS: 5 days   Time spent: 40 minutes  Carollee Herter,  DO  [facility]  06/25/2023, 9:02 AM

## 2023-06-26 ENCOUNTER — Non-Acute Institutional Stay (SKILLED_NURSING_FACILITY): Payer: Self-pay | Admitting: Adult Health

## 2023-06-26 ENCOUNTER — Encounter: Payer: Self-pay | Admitting: Adult Health

## 2023-06-26 DIAGNOSIS — L03115 Cellulitis of right lower limb: Secondary | ICD-10-CM

## 2023-06-26 DIAGNOSIS — F0390 Unspecified dementia without behavioral disturbance: Secondary | ICD-10-CM | POA: Insufficient documentation

## 2023-06-26 DIAGNOSIS — I152 Hypertension secondary to endocrine disorders: Secondary | ICD-10-CM | POA: Insufficient documentation

## 2023-06-26 DIAGNOSIS — E1159 Type 2 diabetes mellitus with other circulatory complications: Secondary | ICD-10-CM | POA: Diagnosis not present

## 2023-06-26 DIAGNOSIS — E1169 Type 2 diabetes mellitus with other specified complication: Secondary | ICD-10-CM | POA: Diagnosis not present

## 2023-06-26 DIAGNOSIS — M810 Age-related osteoporosis without current pathological fracture: Secondary | ICD-10-CM | POA: Insufficient documentation

## 2023-06-26 NOTE — Progress Notes (Signed)
 Location:  [facility] Room Number: 134 Place of Service:  SNF (31)   CODE STATUS: full  Allergies  Allergen Reactions   Aloe Rash   Metformin And Related     Had lactic acidosis   Penicillins Rash   Tape Rash    Chief Complaint  Patient presents with   Hospitalization Follow-up    HPI:  She is a 78 year old woman who has been hospitalized from 06-20-23 through 06-25-23. Her past medical history includes: type 2 diabetes mellitus; hypertension; dementia. She presented to the ED with an acute onset of right foot swelling that started on the dorsum and had gotten significantly worse. She had gone to a podiatrist a couple of months ago for an ingrown toenail and also had a scalpel resection of a callus.  There were no reports of fevers present. She was started on IV cefepime and vancomycin. She is being discharged on oral abt. She is here for short term rehab with her goal to return back home. She will continue to be followed for her chronic illnesses including: Hypertension associated with type 2 diabetes mellitus:type 2 diabetes mellitus with obesity:  Postmenopausal osteoporosis   Past Medical History:  Diagnosis Date   Diabetes mellitus without complication (HCC)    Hyperlipidemia    Hypertension     Past Surgical History:  Procedure Laterality Date   AORTIC VALVE REPLACEMENT     BREAST EXCISIONAL BIOPSY Left    benign excision    Social History   Socioeconomic History   Marital status: Divorced    Spouse name: Not on file   Number of children: Not on file   Years of education: Not on file   Highest education level: Not on file  Occupational History   Not on file  Tobacco Use   Smoking status: Never   Smokeless tobacco: Never  Vaping Use   Vaping status: Never Used  Substance and Sexual Activity   Alcohol use: No   Drug use: Never   Sexual activity: Not on file  Other Topics Concern   Not on file  Social History Narrative   Not on  file   Social Drivers of Health   Financial Resource Strain: Not on file  Food Insecurity: No Food Insecurity (06/21/2023)   Hunger Vital Sign    Worried About Running Out of Food in the Last Year: Never true    Ran Out of Food in the Last Year: Never true  Transportation Needs: No Transportation Needs (06/20/2023)   PRAPARE - Administrator, Civil Service (Medical): No    Lack of Transportation (Non-Medical): No  Physical Activity: Not on file  Stress: Not on file  Social Connections: Unknown (06/21/2023)   Social Connection and Isolation Panel [NHANES]    Frequency of Communication with Friends and Family: More than three times a week    Frequency of Social Gatherings with Friends and Family: Three times a week    Attends Religious Services: More than 4 times per year    Active Member of Clubs or Organizations: No    Attends Banker Meetings: Never    Marital Status: Patient declined  Intimate Partner Violence: Not At Risk (06/20/2023)   Humiliation, Afraid, Rape, and Kick questionnaire    Fear of Current or Ex-Partner: No    Emotionally Abused: No    Physically Abused: No    Sexually Abused: No   Family History  Problem Relation Age  of Onset   Breast cancer Neg Hx       VITAL SIGNS BP (!) 110/54   Pulse 75   Temp (!) 97.4 F (36.3 C)   Resp 20   Ht 5\' 1"  (1.549 m)   Wt 231 lb 3.2 oz (104.9 kg)   SpO2 97%   BMI 43.68 kg/m   Outpatient Encounter Medications as of 06/26/2023  Medication Sig   insulin aspart (NOVOLOG) 100 UNIT/ML injection Inject 5 Units into the skin 3 (three) times daily before meals. For cbg >150   oxyCODONE (OXY IR/ROXICODONE) 5 MG immediate release tablet Take 5 mg by mouth every 6 (six) hours as needed for severe pain (pain score 7-10).   acetaminophen (TYLENOL) 500 MG tablet Take 2 tablets (1,000 mg total) by mouth every 6 (six) hours as needed for mild pain (pain score 1-3), fever or headache (or Fever >/= 101).    alendronate (FOSAMAX) 70 MG tablet Take 70 mg by mouth once a week.   aspirin 81 MG chewable tablet Chew 81 mg by mouth daily.   carvedilol (COREG) 6.25 MG tablet Take 1 tablet (6.25 mg total) by mouth 2 (two) times daily.   cefadroxil (DURICEF) 500 MG capsule Take 1 capsule (500 mg total) by mouth 2 (two) times daily for 14 days.   Cholecalciferol (VITAMIN D3) 5000 units TABS Take 5,000 Units by mouth daily.   Coenzyme Q-10 200 MG CAPS Take 1 capsule by mouth daily.   doxycycline (VIBRA-TABS) 100 MG tablet Take 1 tablet (100 mg total) by mouth 2 (two) times daily for 14 days.   insulin aspart protamine- aspart (NOVOLOG MIX 70/30) (70-30) 100 UNIT/ML injection Inject 0.3 mLs (30 Units total) into the skin daily with supper.   insulin aspart protamine- aspart (NOVOLOG MIX 70/30) (70-30) 100 UNIT/ML injection Inject 0.4 mLs (40 Units total) into the skin daily with breakfast.   lisinopril (ZESTRIL) 10 MG tablet Take 1 tablet by mouth daily.   Magnesium Oxide, Antacid, 500 MG CAPS Take 1 capsule by mouth daily.   metroNIDAZOLE (FLAGYL) 500 MG tablet Take 1 tablet (500 mg total) by mouth 2 (two) times daily for 14 days.   Multiple Vitamin (MULTIVITAMIN WITH MINERALS) TABS tablet Take 1 tablet by mouth daily.   Multiple Vitamins-Minerals (PRESERVISION AREDS 2+MULTI VIT PO) Take 1 capsule by mouth 2 (two) times daily.   Omega-3 Fatty Acids (FISH OIL) 1000 MG CAPS Take 1 capsule by mouth daily.   ondansetron (ZOFRAN) 4 MG tablet Take 1 tablet (4 mg total) by mouth every 6 (six) hours as needed for nausea.   [DISCONTINUED] insulin aspart (NOVOLOG) 100 UNIT/ML injection Inject 0-15 Units into the skin 3 (three) times daily with meals. CBG 70 - 120: 0 units CBG 121 - 150: 2 units CBG 151 - 200: 3 units CBG 201 - 250: 5 units CBG 251 - 300: 8 units CBG 301 - 350: 11 units CBG 351 - 400: 15 units (Patient taking differently: Inject 5 Units into the skin 3 (three) times daily with meals. For cbg >150)    [DISCONTINUED] oxyCODONE (OXY IR/ROXICODONE) 5 MG immediate release tablet Take 1 tablet (5 mg total) by mouth every 4 (four) hours as needed for moderate pain (pain score 4-6). (Patient taking differently: Take 5 mg by mouth every 6 (six) hours as needed for severe pain (pain score 7-10).)   No facility-administered encounter medications on file as of 06/26/2023.     SIGNIFICANT DIAGNOSTIC EXAMS  LABS  06-15-23:  hgb A1c 10.5; chol 218; ldl 139; trig 172; hdl 44.8 06-20-23: wbc 9.0; hgb 15.3; hct 45.9; mcv 95.2 plt 229; glucose 196; bun 17; creat 0.59; k+ 4.2; na++ 131; ca 9.5 gfr >60 06-22-23: glucose 145; bun 13; creat 0.39; k+ 4.2; na++ 131; ca 9.5 gfr >60 06-25-23: wbc 8.8; hgb 12.9; hct 39.6; mcv 95.9 plt 279; glucose 107; bun 13; creat 0.38; k+ 3.9; na++ 134; ca 9.1; gfr >60   Review of Systems  Constitutional:  Negative for malaise/fatigue.  Respiratory:  Negative for cough and shortness of breath.   Cardiovascular:  Negative for chest pain, palpitations and leg swelling.  Gastrointestinal:  Negative for abdominal pain, constipation and heartburn.  Musculoskeletal:  Positive for joint pain. Negative for back pain and myalgias.       Right foot pain   Skin: Negative.   Neurological:  Negative for dizziness.  Psychiatric/Behavioral:  The patient is not nervous/anxious.     Physical Exam Constitutional:      General: She is not in acute distress.    Appearance: She is well-developed. She is morbidly obese. She is not diaphoretic.  Neck:     Thyroid: No thyromegaly.  Cardiovascular:     Rate and Rhythm: Normal rate and regular rhythm.     Heart sounds: Murmur heard.  Pulmonary:     Effort: Pulmonary effort is normal. No respiratory distress.     Breath sounds: Normal breath sounds.  Abdominal:     General: Bowel sounds are normal. There is no distension.     Palpations: Abdomen is soft.     Tenderness: There is no abdominal tenderness.  Musculoskeletal:        General: Normal  range of motion.     Cervical back: Neck supple.     Right lower leg: No edema.     Left lower leg: No edema.  Lymphadenopathy:     Cervical: No cervical adenopathy.  Skin:    General: Skin is warm and dry.     Comments: Right foot without edema present; mild redness present; has blister at base of toes   Neurological:     General: No focal deficit present.     Mental Status: She is alert.  Psychiatric:        Mood and Affect: Mood normal.      ASSESSMENT/ PLAN:  TODAY  Cellulitis right foot: is presently taking doxycycline 100 mg twice daily through 07-09-23; flagyl 500 mg twice daily through 07-09-23; cefadroxil 500 mg twice daily through 07-09-23.    2. Hypertension associated with type 2 diabetes mellitus: b/p 110/54 will continue lisinopril 10 mg daily coreg 6.25 mg twice daily   3. Type 2 diabetes mellitus with obesity: hgb A1c 10.0; will continue novolog 5 units with meals for cbg >150; novolog mix 70/30: 40 units in the AM and 30 units in the PM  4. Postmenopausal osteoporosis: will continue fosamax 70 mg weekly   5. Obesity class III: BMI 43.68  6. Dementia without behavioral disturbance   Synthia Innocent NP [facility]  call [phone]

## 2023-06-27 ENCOUNTER — Encounter: Payer: Self-pay | Admitting: Internal Medicine

## 2023-06-27 ENCOUNTER — Non-Acute Institutional Stay (SKILLED_NURSING_FACILITY): Payer: Self-pay | Admitting: Internal Medicine

## 2023-06-27 DIAGNOSIS — E1169 Type 2 diabetes mellitus with other specified complication: Secondary | ICD-10-CM | POA: Diagnosis not present

## 2023-06-27 DIAGNOSIS — L03115 Cellulitis of right lower limb: Secondary | ICD-10-CM | POA: Diagnosis not present

## 2023-06-27 DIAGNOSIS — I739 Peripheral vascular disease, unspecified: Secondary | ICD-10-CM | POA: Diagnosis not present

## 2023-06-27 NOTE — Patient Instructions (Signed)
 See assessment and plan under each diagnosis in the problem list and acutely for this visit

## 2023-06-27 NOTE — Assessment & Plan Note (Signed)
 She is on 70/30 insulin twice daily; doses have been increased as glucoses have SNF and ranged 156 up to 389.  A1c while hospitalized was 10% indicating poor diabetic control.  Associated 40% increase risk was discussed with her.  Endocrinology follow-up post rehab.

## 2023-06-27 NOTE — Progress Notes (Unsigned)
 NURSING HOME LOCATION:  [facility] ROOM NUMBER:  134 P  CODE STATUS:  Full Code  PCP:  Jerl Mina MD  This is a comprehensive admission note to this SNFperformed on this date less than 30 days from date of admission. Included are preadmission medical/surgical history; reconciled medication list; family history; social history and comprehensive review of systems.  Corrections and additions to the records were documented. Comprehensive physical exam was also performed. Additionally a clinical summary was entered for each active diagnosis pertinent to this admission in the Problem List to enhance continuity of care.  HPI: She was hospitalized 3/12 - 06/25/2023 presenting to the ED with acute onset of worsening right foot swelling with clinical cellulitis.  This is in the context of prior scalpel resection of a callus by her podiatrist a couple months PTA.  She is an insulin-dependent diabetic and glucoses have been dropping recently prompting her to decrease her long-acting insulin from 75 to 35 units. White count was 9000.  Imaging revealed generalized soft tissue edema; no osteomyelitis was documented.  Cefepime and vancomycin were initiated parenterally.  MRI 3/13 revealed right foot abscess.  Clinically there was significant improvement in the cellulitis over the dorsum of the right foot; but severe PVD with a contraindication to surgical intervention. On 12/27/2022 ABIs had revealed moderate to severe right lower extremity peripheral arterial disease and moderate left lower extremity peripheral arterial disease.  Findings suggested inflow (aortoiliac) occlusive disease.  Her insulin regimen was continued while hospitalized.  During hospitalization glucoses ranged from a level of 107 up to a high of 311.  For diabetic control was documented by an A1c of 10.0% on 3/12.  Initially she refused SNF placement but subsequently changed her mind.  At discharge antibiotic therapy  transitioned to [facility] and doxycycline for 2 weeks. Outpatient follow-up will be with [facility] in [place].  Past medical and surgical history includes dyslipidemia,history of carotid stenosis, hypertension, and diabetes with vascular complications. 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  Family history: reviewed, non contributory due to advanced age.  Social history: Nondrinker, non-smoker.   Review of systems: She is cognizant of the diagnosis and recommended treatments.  She states that she was in the hospital for "infection in my foot."  She states that it is hurting especially under the toes, the sole, and the lateral foot area.  Wound care nurse stated that the blister on the dorsum of the foot ruptured last night..  She has been applying a clean larger sock the patient to keep the leg elevated is much as possible. At home she states that she was on 65 units of insulin in the morning and 40 5 in the evening and had good control.  I explained the significance of the A1c of 10%. Here at the facility glucoses are running from a low of the 156 up to high of 389.  The 7030 dosages of insulin have been increased. Constitutional: No fever, significant weight change, fatigue  Eyes: No redness, discharge, pain, vision change ENT/mouth: No nasal congestion, purulent discharge, earache, change in hearing, sore throat  Cardiovascular: No chest pain, palpitations, paroxysmal nocturnal dyspnea, claudication, edema  Respiratory: No cough, sputum production, hemoptysis, DOE, significant snoring, apnea Gastrointestinal: No heartburn, dysphagia, abdominal pain, nausea /vomiting, rectal bleeding, melena, change in bowels Genitourinary: No dysuria, hematuria, pyuria, incontinence, nocturia Musculoskeletal: No joint stiffness, joint swelling, weakness, pain Dermatologic: No rash, pruritus, change in appearance of skin  Neurologic: No dizziness,  headache, syncope, seizures, numbness, tingling Psychiatric: No significant anxiety, depression, insomnia, anorexia Endocrine: No change in hair/skin/nails, excessive thirst, excessive hunger, excessive urination  Hematologic/lymphatic: No significant bruising, lymphadenopathy, abnormal bleeding Allergy/immunology: No itchy/watery eyes, significant sneezing, urticaria, angioedema  Physical exam:  Pertinent or positive findings: She was initially asleep exhibiting hypopnea frank apnea or snoring.  Morbid obesity is present.  She has low-grade rhonchorous murmur at the base and left sternal border with extension of the carotids more so on the left.  Central obesity is present.  Pedal pulses are decreased but palpable.  General appearance: Adequately nourished; no acute distress, increased work of breathing is present.   Lymphatic: No lymphadenopathy about the head, neck, axilla. Eyes: No conjunctival inflammation or lid edema is present. There is no scleral icterus. Ears:  External ear exam shows no significant lesions or deformities.   Nose:  External nasal examination shows no deformity or inflammation. Nasal mucosa are pink and moist without lesions, exudates Oral exam: Lips and gums are healthy appearing.There is no oropharyngeal erythema or exudate. Neck:  No thyromegaly, masses, tenderness noted.    Heart:  Normal rate and regular rhythm. S1 and S2 normal without gallop, murmur, click, rub.  Lungs: Chest clear to auscultation without wheezes, rhonchi, rales, rubs. Abdomen: Bowel sounds are normal.  Abdomen is soft and nontender with no organomegaly, hernias, masses. GU: Deferred  Extremities:  No cyanosis, clubbing, edema. Neurologic exam:  Strength equal  in upper & lower extremities. Balance, Rhomberg, finger to nose testing could not be completed due to clinical state Deep tendon reflexes are equal Skin: Warm & dry w/o tenting. No significant lesions or rash.  See clinical summary  under each active problem in the Problem List with associated updated therapeutic plan

## 2023-06-27 NOTE — Assessment & Plan Note (Signed)
 Pedal pulses are decreased but palpable.  The critical need to keep the legs elevated much as possible was discussed with her to decrease swelling and inflammation and to enhance healing.

## 2023-06-27 NOTE — Assessment & Plan Note (Signed)
 Wound care nurse at SNF monitoring.  Blister over the dorsum of the right foot ruptured the evening of 3/18.  Cleaning and occlusive sock applied.  She is to complete 14 days of oral antibiotics.

## 2023-06-28 ENCOUNTER — Non-Acute Institutional Stay (SKILLED_NURSING_FACILITY): Payer: Self-pay | Admitting: Internal Medicine

## 2023-06-28 ENCOUNTER — Encounter: Payer: Self-pay | Admitting: Internal Medicine

## 2023-06-28 DIAGNOSIS — E1169 Type 2 diabetes mellitus with other specified complication: Secondary | ICD-10-CM

## 2023-06-28 DIAGNOSIS — I739 Peripheral vascular disease, unspecified: Secondary | ICD-10-CM | POA: Diagnosis not present

## 2023-06-28 DIAGNOSIS — L03115 Cellulitis of right lower limb: Secondary | ICD-10-CM | POA: Diagnosis not present

## 2023-06-28 NOTE — Progress Notes (Signed)
   NURSING HOME LOCATION:  [facility] ROOM NUMBER:  134 P  CODE STATUS:  Full Code  PCP: Jerl Mina, MD   This is a nursing facility follow up visit for specific acute issue of.possible cellulitis progression.  Interim medical record and care since last SNF visit was updated with review of diagnostic studies and change in clinical status since last visit were documented.  HPI: [facility] Wound Care Nurse stated that the patient & her granddaughter were concerned that the cellulitis in the right foot was progressing and that this might require return to the hospital for IV antibiotics.  The patient continues to complain of discomfort in the foot particularly with manipulation.  There was concern there may be bleeding between the toes. She continues on Duricef, doxycycline, metronidazole twice daily to complete a 14-day course.  No fever has been reported and no purulent drainage observed by staff. The patient's glucose monitor documented a glucose of 58 last night at midnight.  The granddaughter stated that her grandmother was hallucinating this morning.  Review of systems: The patient continues to have discomfort in the foot; she does not want to take opioids because of the way they make her feel.  She does have Tylenol ordered.  Constitutional: No fever, significant weight change Cardiovascular: No chest pain, palpitations, paroxysmal nocturnal dyspnea, claudication, edema  Respiratory: No cough, sputum production, hemoptysis, DOE, significant snoring, apnea   Genitourinary: No dysuria, hematuria, pyuria, incontinence, nocturia Neurologic: No dizziness, headache, syncope, seizures Psychiatric: No significant anxiety, depression, insomnia, anorexia Endocrine: No change in hair/nails, excessive thirst, excessive hunger, excessive urination  Hematologic/lymphatic: No significant bruising, lymphadenopathy  Physical exam:  Pertinent or positive findings: Breath sounds  are decreased but chest is clear.  She has a low-grade honking murmur loudest at the left sternal border.  Abdomen is protuberant.  Pedal pulses are not palpable.  There is some hyperpigmentation of the dorsum of the right foot without clinical cellulitis. Compared to the serial photos of the foot while hospitalized the skin changes are stable to improved. The foot is edematous.  At the base of the toes inferiorly there is pale swollen tissue suggestive of fluid retention.  Wound care nurse has noted clear drainage from this area.There is no bleeding present.  General appearance: Adequately nourished; no acute distress, increased work of breathing is present.   Lymphatic: No lymphadenopathy about the head, neck, axilla. Eyes: No conjunctival inflammation or lid edema is present. There is no scleral icterus. Heart:  Normal rate and regular rhythm. S1 and S2 normal without gallop,  click, rub .  Lungs: without wheezes, rhonchi, rales, rubs. Abdomen: Bowel sounds are normal. Abdomen is soft and nontender with no organomegaly, hernias, masses. GU: Deferred  Extremities:  No cyanosis, clubbing Skin: Warm & dry w/o tenting.  See summary under each active problem in the Problem List with associated updated therapeutic plan

## 2023-06-28 NOTE — Assessment & Plan Note (Addendum)
 The roles poorly controlled diabetes and significant ABI documented peripheral vascular disease are playing in delayed healing was discussed with the patient and her granddaughter.

## 2023-06-28 NOTE — Assessment & Plan Note (Addendum)
 On 70 /30 36 units in the evening she had a glucose of 58 at midnight last night.  Her diabetes appears quite labile clinically.The 70/30 will be decreased to 30 units  & we will continue to monitor.  The granddaughter question hallucinations this morning; the patient is lucid and communicative @ this time.

## 2023-06-28 NOTE — Patient Instructions (Signed)
 See assessment and plan under each diagnosis in the problem list and acutely for this visit

## 2023-06-28 NOTE — Assessment & Plan Note (Addendum)
 Clinically there has not been progression of his cellulitis on the triple antibiotic course.  There has been progressive edema with some fluid retention at the base of the toes inferiorly.  Again it was stressed to the patient that she will need to keep the foot elevated as much as possible. PT should not exacerbate this as muscular contraction of her calves may actually improve blood return.  Wound Care Nurse to continue to monitor for any signs of cellulitis progression.

## 2023-07-05 ENCOUNTER — Non-Acute Institutional Stay (SKILLED_NURSING_FACILITY): Payer: Self-pay | Admitting: Internal Medicine

## 2023-07-05 ENCOUNTER — Encounter: Payer: Self-pay | Admitting: Internal Medicine

## 2023-07-05 DIAGNOSIS — E1169 Type 2 diabetes mellitus with other specified complication: Secondary | ICD-10-CM

## 2023-07-05 DIAGNOSIS — I10 Essential (primary) hypertension: Secondary | ICD-10-CM

## 2023-07-05 DIAGNOSIS — L03115 Cellulitis of right lower limb: Secondary | ICD-10-CM

## 2023-07-05 NOTE — Patient Instructions (Signed)
 See assessment and plan under each diagnosis in the problem list and acutely for this visit

## 2023-07-05 NOTE — Assessment & Plan Note (Signed)
 BP controlled; no change in antihypertensive medications

## 2023-07-05 NOTE — Assessment & Plan Note (Addendum)
 Wound Care Nurse has continued to monitor the cellulitis.  Antibiotics will continue through 07/08/2023. Home Health PT/OT ordered.  No DME needed. She states that she will not return to [facility] in [place].  Dr. Tedd Sias has recommended another podiatrist to her.

## 2023-07-05 NOTE — Progress Notes (Unsigned)
 NURSING HOME LOCATION:  [facility] ROOM NUMBER:  134 P  CODE STATUS:  Full Code  PCP: Jerl Mina, MD   This is a nursing facility follow up visit for potential discharge 07/07/2023.  Interim medical record and care since last SNF visit was updated with review of diagnostic studies and change in clinical status since last visit were documented.  HPI: She was admitted to this facility on 06/25/2023 after being hospitalized 3/12 - 3/17 for acute cellulitis of the right lower extremity.  This is in context of prior podiatric procedure several months PTA involving resection of callus. Imaging did not suggest associated osteomyelitis.  MRI 3/13 had confirmed an abscess of the right foot.  Because of severe PVD surgical intervention was not pursued and she received cefepime and vancomycin parenterally.  Most recent ABIs on 12/27/2022 had revealed moderate-severe right lower extremity peripheral arterial disease and moderate left lower extremity peripheral arterial disease.  Inflow or aortic iliac occlusive disease was suggested as the etiology. While hospitalized there is marked variability in glucoses with a range from a low 107 up to high of 311.  A1c was 10% on 3/12 indicating poor diabetic control. She eventually agreed to SNF placement for rehab with plan to follow-up with [facility] in . Here at the SNF she has been ambulating with a rolling walker.  Insulin regimen was adjusted and the average glucose was 203 with a range of 106 up to 389.  Both of these were outliers.  There was 1 reported glucose of 58 on 3/19 which prompted decreasing her evening 70/30 insulin to 30 units.   Review of systems: She states that she is not having pain in the foot unless she puts pressure on the anterior sole at the site of the previous callus resection.  She denies any other active symptoms.  Constitutional: No fever, significant weight change, fatigue  Eyes: No  redness, discharge, pain, vision change ENT/mouth: No nasal congestion,  purulent discharge, earache, change in hearing, sore throat  Cardiovascular: No chest pain, palpitations, paroxysmal nocturnal dyspnea, claudication, edema  Respiratory: No cough, sputum production, hemoptysis, DOE, significant snoring, apnea   Gastrointestinal: No heartburn, dysphagia, abdominal pain, nausea /vomiting, rectal bleeding, melena, change in bowels Genitourinary: No dysuria, hematuria, pyuria, incontinence, nocturia Musculoskeletal: No joint stiffness, joint swelling, weakness, pain Dermatologic: No rash, pruritus, change in appearance of skin Neurologic: No dizziness, headache, syncope, seizures, numbness, tingling Psychiatric: No significant anxiety, depression, insomnia, anorexia Endocrine: No change in hair/skin/nails, excessive thirst, excessive hunger, excessive urination  Hematologic/lymphatic: No significant bruising, lymphadenopathy, abnormal bleeding Allergy/immunology: No itchy/watery eyes, significant sneezing, urticaria, angioedema  Physical exam:  Pertinent or positive findings: She was asleep resting comfortably without snoring or apnea.  She awakened easily.  There is a grade 3/4-1 honking murmur at the base and left sternal border.  Abdomen is protuberant and reveals central obesity.  Pedal pulses are decreased.  She has trace edema at the sock line.  There is 1/2+ edema over the right foot.  There is bland hyperpigmentation and irregular distribution over the distal dorsal right foot without any evidence of cellulitis.  There is marked exfoliation at the dorsal base of the third and fourth toes.  There is also hypopigmented tissue at the base of the right toes inferiorly.  There is a flat, punctate area suggesting granuloma over the anterior sole.  General appearance: Adequately nourished; no acute distress, increased work of breathing is present.   Lymphatic: No  lymphadenopathy about the head,  neck, axilla. Eyes: No conjunctival inflammation or lid edema is present. There is no scleral icterus. Ears:  External ear exam shows no significant lesions or deformities.   Nose:  External nasal examination shows no deformity or inflammation. Nasal mucosa are pink and moist without lesions, exudates Oral exam:  Lips and gums are healthy appearing. There is no oropharyngeal erythema or exudate. Neck:  No thyromegaly, masses, tenderness noted.    Heart:  Normal rate and regular rhythm. S1 and S2 normal without gallop, murmur, click, rub .  Lungs: Chest clear to auscultation without wheezes, rhonchi, rales, rubs. Abdomen: Bowel sounds are normal. Abdomen is soft and nontender with no organomegaly, hernias, masses. GU: Deferred  Extremities:  No cyanosis, clubbing, edema  Neurologic exam : Cn 2-7 intact Strength equal  in upper & lower extremities Balance, Rhomberg, finger to nose testing could not be completed due to clinical state Deep tendon reflexes are equal Skin: Warm & dry w/o tenting. No significant lesions or rash.  See summary under each active problem in the Problem List with associated updated therapeutic plan

## 2023-07-05 NOTE — Assessment & Plan Note (Addendum)
 Following decrease in the evening 70/30 insulin dose glucoses have ranged from 106 up to 389, both outliers.  The average has been 203.  Endocrinology follow-up with Dr.Solum will be pursued. At discharge she was on NovoLog 70/30 40 units with breakfast and 30 units with the evening meal.  She also was to receive NovoLog 5 units before meals if glucose is greater than 150.

## 2023-08-28 ENCOUNTER — Encounter (INDEPENDENT_AMBULATORY_CARE_PROVIDER_SITE_OTHER): Payer: Self-pay

## 2023-10-01 ENCOUNTER — Other Ambulatory Visit (INDEPENDENT_AMBULATORY_CARE_PROVIDER_SITE_OTHER): Payer: Self-pay | Admitting: Nurse Practitioner

## 2023-10-01 DIAGNOSIS — I739 Peripheral vascular disease, unspecified: Secondary | ICD-10-CM

## 2023-10-01 DIAGNOSIS — L089 Local infection of the skin and subcutaneous tissue, unspecified: Secondary | ICD-10-CM

## 2023-10-02 ENCOUNTER — Encounter (INDEPENDENT_AMBULATORY_CARE_PROVIDER_SITE_OTHER): Payer: Self-pay | Admitting: Nurse Practitioner

## 2023-10-02 ENCOUNTER — Ambulatory Visit (INDEPENDENT_AMBULATORY_CARE_PROVIDER_SITE_OTHER)

## 2023-10-02 ENCOUNTER — Ambulatory Visit (INDEPENDENT_AMBULATORY_CARE_PROVIDER_SITE_OTHER): Admitting: Nurse Practitioner

## 2023-10-02 VITALS — BP 124/74 | HR 80 | Ht 61.0 in | Wt 223.0 lb

## 2023-10-02 DIAGNOSIS — I70213 Atherosclerosis of native arteries of extremities with intermittent claudication, bilateral legs: Secondary | ICD-10-CM

## 2023-10-02 DIAGNOSIS — I6523 Occlusion and stenosis of bilateral carotid arteries: Secondary | ICD-10-CM

## 2023-10-02 DIAGNOSIS — I739 Peripheral vascular disease, unspecified: Secondary | ICD-10-CM

## 2023-10-02 DIAGNOSIS — L089 Local infection of the skin and subcutaneous tissue, unspecified: Secondary | ICD-10-CM

## 2023-10-02 DIAGNOSIS — E119 Type 2 diabetes mellitus without complications: Secondary | ICD-10-CM | POA: Diagnosis not present

## 2023-10-02 DIAGNOSIS — Z794 Long term (current) use of insulin: Secondary | ICD-10-CM

## 2023-10-02 DIAGNOSIS — I10 Essential (primary) hypertension: Secondary | ICD-10-CM | POA: Diagnosis not present

## 2023-10-02 NOTE — Progress Notes (Signed)
 Subjective:    Patient ID: Jessica Cuevas, female    DOB: [birth year], 78 y.o.   MRN: [id number] Chief Complaint  Patient presents with   LS 6.22.23 GS. ABI + consult. right foot infection X 1 mont    The patient is a 78 year old female who returns today for follow-up evaluation of her peripheral arterial disease.  We initially saw the patient in 2023 for evaluation for carotid artery stenosis and it was recommended at that time she undergo a CTA neck however the patient did not undergo this scan and she had been lost to follow-up.  She was sent as a referral by Dr. Lennie in regards to an open wound on her right lower extremity.  This happened after the wound itself was trimmed by a podiatrist.  Following this the wound was slow to heal and the patient went to [place] for a wedding for her daughter.  She notes that during this time she was walking around significant distances as well as standing on her feet and she began to experience much worsening pain, swelling with inflammation and infection.  She notes that the wound itself is healed and she does not have any further issues from the wound itself.  There is also no evidence of infection noted today.  The patient does endorse having some claudication-like symptoms with weakness in her lower extremities if she walks too long of a distance.  She denies rest pain like symptoms.  She denies any TIA or amaurosis fugax like symptoms.  She does have some notable varicosities bilaterally.  Today noninvasive studies show an ABI 0.53 on the right and 0.52 on the left.  She has dampened TBI's of 0.16 on the right and 0.28 on the left.  The patient additionally has monophasic tibial waveforms.  This is somewhat consistent with the previous ABIs that she had done on 12/27/2022.  At that time the right ABI was 0.5 and the left was 0.79.    Review of Systems  Cardiovascular:        Claudication  Skin:  Negative for wound.  All other systems reviewed and are  negative.      Objective:   Physical Exam Vitals reviewed.  HENT:     Head: Normocephalic.   Cardiovascular:     Rate and Rhythm: Normal rate.     Pulses:          Dorsalis pedis pulses are detected w/ Doppler on the right side and detected w/ Doppler on the left side.       Posterior tibial pulses are detected w/ Doppler on the right side and detected w/ Doppler on the left side.  Pulmonary:     Effort: Pulmonary effort is normal.   Musculoskeletal:     Right lower leg: No edema.     Left lower leg: No edema.   Skin:    General: Skin is warm and dry.   Neurological:     Mental Status: She is alert and oriented to person, place, and time.   Psychiatric:        Mood and Affect: Mood normal.        Behavior: Behavior normal.        Thought Content: Thought content normal.        Judgment: Judgment normal.     BP 124/74   Pulse 80   Ht 5' 1 (1.549 m)   Wt 223 lb (101.2 kg)   BMI 42.14 kg/m   Past Medical  History:  Diagnosis Date   Diabetes mellitus without complication (HCC)    Hyperlipidemia    Hypertension     Social History   Socioeconomic History   Marital status: Divorced    Spouse name: Not on file   Number of children: Not on file   Years of education: Not on file   Highest education level: Not on file  Occupational History   Not on file  Tobacco Use   Smoking status: Never   Smokeless tobacco: Never  Vaping Use   Vaping status: Never Used  Substance and Sexual Activity   Alcohol use: No   Drug use: Never   Sexual activity: Not on file  Other Topics Concern   Not on file  Social History Narrative   Not on file   Social Drivers of Health   Financial Resource Strain: Low Risk  (08/14/2023)   Received from [facility]   Overall Financial Resource Strain (CARDIA)    Difficulty of Paying Living Expenses: Not hard at all  Food Insecurity: No Food Insecurity (08/14/2023)   Received from [facility]    Hunger Vital Sign    Within the past 12 months, you worried that your food would run out before you got the money to buy more.: Never true    Within the past 12 months, the food you bought just didn't last and you didn't have money to get more.: Never true  Transportation Needs: No Transportation Needs (08/14/2023)   Received from [facility] - Transportation    In the past 12 months, has lack of transportation kept you from medical appointments or from getting medications?: No    Lack of Transportation (Non-Medical): No  Physical Activity: Not on file  Stress: Not on file  Social Connections: Unknown (06/21/2023)   Social Connection and Isolation Panel    Frequency of Communication with Friends and Family: More than three times a week    Frequency of Social Gatherings with Friends and Family: Three times a week    Attends Religious Services: More than 4 times per year    Active Member of Clubs or Organizations: No    Attends Banker Meetings: Never    Marital Status: Patient declined  Intimate Partner Violence: Not At Risk (06/20/2023)   Humiliation, Afraid, Rape, and Kick questionnaire    Fear of Current or Ex-Partner: No    Emotionally Abused: No    Physically Abused: No    Sexually Abused: No    Past Surgical History:  Procedure Laterality Date   AORTIC VALVE REPLACEMENT     BREAST EXCISIONAL BIOPSY Left    benign excision    Family History  Problem Relation Age of Onset   Breast cancer Neg Hx     Allergies  Allergen Reactions   Aloe Rash   Metformin And Related     Had lactic acidosis   Penicillins Rash   Tape Rash       Latest Ref Rng & Units 06/25/2023    4:18 AM 06/23/2023    5:01 AM 06/21/2023    4:20 AM  CBC  WBC 4.0 - 10.5 K/uL 8.8  9.2  9.2   Hemoglobin 12.0 - 15.0 g/dL 87.0  86.2  86.0   Hematocrit 36.0 - 46.0 % 39.6  41.8  42.6   Platelets 150 - 400 K/uL 279  248  229       CMP  Component Value Date/Time    NA 134 (L) 06/25/2023 0418   K 3.9 06/25/2023 0418   CL 103 06/25/2023 0418   CO2 23 06/25/2023 0418   GLUCOSE 107 (H) 06/25/2023 0418   BUN 13 06/25/2023 0418   CREATININE 0.38 (L) 06/25/2023 0418   CALCIUM 9.1 06/25/2023 0418   PROT 7.1 06/23/2023 0501   ALBUMIN 3.0 (L) 06/23/2023 0501   AST 23 06/23/2023 0501   ALT 18 06/23/2023 0501   ALKPHOS 38 06/23/2023 0501   BILITOT 0.6 06/23/2023 0501   GFRNONAA >60 06/25/2023 0418     No results found.     Assessment & Plan:   1. Atherosclerosis of native artery of both lower extremities with intermittent claudication (HCC) (Primary) The patient does have notably decreased ABIs bilaterally.  However the previous wound and infection that was in the right leg have resolved itself at this time.  She does have claudication-like symptoms but at this time it does not seem that they are lifestyle limiting or disabling for her.  We have discussed options for treatment of peripheral arterial disease from conservative therapies to interventional therapy.  Patient is apprehensive about undergoing intervention so at this time we will proceed more conservatively.  The patient is currently on aspirin  and is recommended to continue with this.  Statin is also recommended but the patient declines this at this time.  We also discussed the worrisome signs symptoms such as worsening claudication symptoms or shorten distance, the development of rest pain or development of open wounds or ulcerations.  If she develops these she should contact as soon as possible.  Otherwise we will have the patient return in 3 months with noninvasive studies.  2. Bilateral carotid artery stenosis The patient was last seen in our office in 2023 was noted on CT scan that there is evidence of possible significant carotid disease.  At that time it was recommended that she undergo a CTA of the neck for better evaluation.  Fortunately the patient did not have her CTA done.  Since it has  been 3 years I think it would be prudent to move forward with an ultrasound first  to evaluate carotid stenosis.  Will do this at her follow-up visit.  3. Essential hypertension Continue antihypertensive medications as already ordered, these medications have been reviewed and there are no changes at this time.  4. Type 2 diabetes mellitus without complication, with long-term current use of insulin  (HCC) Continue hypoglycemic medications as already ordered, these medications have been reviewed and there are no changes at this time.  Hgb A1C to be monitored as already arranged by primary service   Current Outpatient Medications on File Prior to Visit  Medication Sig Dispense Refill   acetaminophen  (TYLENOL ) 500 MG tablet Take 2 tablets (1,000 mg total) by mouth every 6 (six) hours as needed for mild pain (pain score 1-3), fever or headache (or Fever >/= 101).     alendronate  (FOSAMAX ) 70 MG tablet Take 70 mg by mouth once a week.     aspirin  81 MG chewable tablet Chew 81 mg by mouth daily.     carvedilol  (COREG ) 6.25 MG tablet Take 1 tablet (6.25 mg total) by mouth 2 (two) times daily.     Cholecalciferol  (VITAMIN D3) 5000 units TABS Take 5,000 Units by mouth daily.     Coenzyme Q-10 200 MG CAPS Take 1 capsule by mouth daily.     insulin  aspart (NOVOLOG ) 100 UNIT/ML injection Inject 5 Units into  the skin 3 (three) times daily before meals. For cbg >150     insulin  aspart protamine- aspart (NOVOLOG  MIX 70/30) (70-30) 100 UNIT/ML injection Inject 0.3 mLs (30 Units total) into the skin daily with supper.     insulin  aspart protamine- aspart (NOVOLOG  MIX 70/30) (70-30) 100 UNIT/ML injection Inject 0.4 mLs (40 Units total) into the skin daily with breakfast.     lisinopril  (ZESTRIL ) 10 MG tablet Take 1 tablet by mouth daily.     Magnesium  Oxide, Antacid, 500 MG CAPS Take 1 capsule by mouth daily.     Multiple Vitamin (MULTIVITAMIN WITH MINERALS) TABS tablet Take 1 tablet by mouth daily.      Multiple Vitamins-Minerals (PRESERVISION AREDS 2+MULTI VIT PO) Take 1 capsule by mouth 2 (two) times daily.     Omega-3 Fatty Acids (FISH OIL) 1000 MG CAPS Take 1 capsule by mouth daily.     ondansetron  (ZOFRAN ) 4 MG tablet Take 1 tablet (4 mg total) by mouth every 6 (six) hours as needed for nausea.     oxyCODONE  (OXY IR/ROXICODONE ) 5 MG immediate release tablet Take 5 mg by mouth every 6 (six) hours as needed for severe pain (pain score 7-10).     No current facility-administered medications on file prior to visit.    There are no Patient Instructions on file for this visit. No follow-ups on file.   Caydn Justen E Casper Pagliuca, NP

## 2023-10-03 LAB — VAS US ABI WITH/WO TBI
Left ABI: 0.52
Right ABI: 0.53

## 2023-12-28 ENCOUNTER — Telehealth: Payer: Self-pay

## 2024-01-03 ENCOUNTER — Encounter (INDEPENDENT_AMBULATORY_CARE_PROVIDER_SITE_OTHER)

## 2024-01-03 ENCOUNTER — Ambulatory Visit (INDEPENDENT_AMBULATORY_CARE_PROVIDER_SITE_OTHER): Admitting: Nurse Practitioner

## 2024-02-07 ENCOUNTER — Other Ambulatory Visit (INDEPENDENT_AMBULATORY_CARE_PROVIDER_SITE_OTHER): Payer: Self-pay | Admitting: Nurse Practitioner

## 2024-02-07 DIAGNOSIS — I70213 Atherosclerosis of native arteries of extremities with intermittent claudication, bilateral legs: Secondary | ICD-10-CM

## 2024-02-07 DIAGNOSIS — I6523 Occlusion and stenosis of bilateral carotid arteries: Secondary | ICD-10-CM

## 2024-02-08 ENCOUNTER — Encounter (INDEPENDENT_AMBULATORY_CARE_PROVIDER_SITE_OTHER)

## 2024-02-08 ENCOUNTER — Ambulatory Visit (INDEPENDENT_AMBULATORY_CARE_PROVIDER_SITE_OTHER): Admitting: Nurse Practitioner

## 2024-03-13 ENCOUNTER — Ambulatory Visit (INDEPENDENT_AMBULATORY_CARE_PROVIDER_SITE_OTHER)

## 2024-03-13 ENCOUNTER — Encounter (INDEPENDENT_AMBULATORY_CARE_PROVIDER_SITE_OTHER): Payer: Self-pay | Admitting: Nurse Practitioner

## 2024-03-13 ENCOUNTER — Ambulatory Visit (INDEPENDENT_AMBULATORY_CARE_PROVIDER_SITE_OTHER): Admitting: Nurse Practitioner

## 2024-03-13 VITALS — BP 167/82 | HR 89 | Resp 17 | Ht 61.0 in | Wt 221.2 lb

## 2024-03-13 DIAGNOSIS — I83813 Varicose veins of bilateral lower extremities with pain: Secondary | ICD-10-CM

## 2024-03-13 DIAGNOSIS — Z794 Long term (current) use of insulin: Secondary | ICD-10-CM | POA: Diagnosis not present

## 2024-03-13 DIAGNOSIS — I6523 Occlusion and stenosis of bilateral carotid arteries: Secondary | ICD-10-CM

## 2024-03-13 DIAGNOSIS — I70213 Atherosclerosis of native arteries of extremities with intermittent claudication, bilateral legs: Secondary | ICD-10-CM | POA: Diagnosis not present

## 2024-03-13 DIAGNOSIS — E119 Type 2 diabetes mellitus without complications: Secondary | ICD-10-CM | POA: Diagnosis not present

## 2024-03-13 DIAGNOSIS — I10 Essential (primary) hypertension: Secondary | ICD-10-CM | POA: Diagnosis not present

## 2024-03-16 ENCOUNTER — Encounter (INDEPENDENT_AMBULATORY_CARE_PROVIDER_SITE_OTHER): Payer: Self-pay | Admitting: Nurse Practitioner

## 2024-03-16 NOTE — Progress Notes (Signed)
 Subjective:    Patient ID: Jessica Cuevas, female    DOB: 12-27-1945, 78 y.o.   MRN: [id number] Chief Complaint  Patient presents with   Follow-up    3 months + ABI + Carotid + Bilat Art Duplex    HPI  Discussed the use of AI scribe software for clinical note transcription with the patient, who gave verbal consent to proceed.  History of Present Illness Jessica Cuevas is a 78 year old female with peripheral artery disease who presents for follow-up of her vascular health.  Present with her daughter today.  Her foot has healed and has not reopened. She takes good care of her feet and does not experience pain at night or issues with sleeping. She experiences fatigue and feels 'tired fast' when walking, attributing it to being out of shape.  A previous CT scan suggested a high degree of carotid stenosis. She recalls that there were plans to get a different CT, but it did not happen. She is aware that her carotid arteries have been monitored and that recent imaging was performed. She has no symptoms such as transient ischemic attacks or syncope. Her external carotid artery has more plaque, and she recalls being told that this was noted on imaging.  Her legs have been problematic, with previous studies showing low blood flow. Her right foot was at 0.53 and left at 0.52, with flow to the toes at 0.16 on the right and 0.28 on the left. Current measurements show improvement with the right leg at 0.63 and left at 0.65, and flow to the toes at 0.39 on the right and 0.48 on the left. She wears compression socks which help alleviate some discomfort. Her legs feel better when wearing them, and they get more tired without them.  She experiences pain in her legs, particularly in the calves, which worsens with walking. She describes the pain as occurring 'sometimes even when sitting on the couch' and after walking short distances like to the mailbox.  She has a history of open heart surgery and a  complication involving a nicked gland in her groin, which required a vacuum device for fluid drainage. She fears the incision might reopen, but it has been eight years since the surgery.  She has a history of five full-term pregnancies, contributing to varicose veins. Her father had a heart condition, and she is aware of genetic factors contributing to her condition.  She is on Ozempic, which has helped lower her blood sugar levels. She was previously experiencing high blood sugar levels but is now under better control.    Results RADIOLOGY Carotid Ultrasound: Internal carotid arteries 1-39% stenosis, vertebral arteries anterograde flow, subclavian arteries normal, external carotid artery increased plaque  DIAGNOSTIC Ankle-Brachial Index: Right 0.63, Left 0.65 (03/13/2024) Toe-Brachial Index: Right 0.39, Left 0.48 (03/13/2024)   Review of Systems  Skin:  Negative for wound.  All other systems reviewed and are negative.      Objective:   Physical Exam Vitals reviewed.  HENT:     Head: Normocephalic.  Cardiovascular:     Rate and Rhythm: Normal rate.     Pulses:          Dorsalis pedis pulses are detected w/ Doppler on the right side and detected w/ Doppler on the left side.       Posterior tibial pulses are detected w/ Doppler on the right side and detected w/ Doppler on the left side.  Pulmonary:     Effort: Pulmonary effort is  normal.  Skin:    General: Skin is warm and dry.  Neurological:     Mental Status: She is alert and oriented to person, place, and time.  Psychiatric:        Mood and Affect: Mood normal.        Behavior: Behavior normal.        Thought Content: Thought content normal.        Judgment: Judgment normal.     Physical Exam    BP (!) 167/82   Pulse 89   Resp 17   Ht 5' 1 (1.549 m)   Wt 221 lb 3.2 oz (100.3 kg)   BMI 41.80 kg/m   Past Medical History:  Diagnosis Date   Diabetes mellitus without complication (HCC)    Hyperlipidemia     Hypertension     Social History   Socioeconomic History   Marital status: Divorced    Spouse name: Not on file   Number of children: Not on file   Years of education: Not on file   Highest education level: Not on file  Occupational History   Not on file  Tobacco Use   Smoking status: Never   Smokeless tobacco: Never  Vaping Use   Vaping status: Never Used  Substance and Sexual Activity   Alcohol use: No   Drug use: Never   Sexual activity: Not on file  Other Topics Concern   Not on file  Social History Narrative   Not on file   Social Drivers of Health   Financial Resource Strain: Low Risk  (08/14/2023)   Received from [facility]   Overall Financial Resource Strain (CARDIA)    Difficulty of Paying Living Expenses: Not hard at all  Food Insecurity: No Food Insecurity (08/14/2023)   Received from [facility]   Hunger Vital Sign    Within the past 12 months, you worried that your food would run out before you got the money to buy more.: Never true    Within the past 12 months, the food you bought just didn't last and you didn't have money to get more.: Never true  Transportation Needs: No Transportation Needs (08/14/2023)   Received from [facility] Transportation    In the past 12 months, has lack of transportation kept you from medical appointments or from getting medications?: No    Lack of Transportation (Non-Medical): No  Physical Activity: Not on file  Stress: Not on file  Social Connections: Unknown (06/21/2023)   Social Connection and Isolation Panel    Frequency of Communication with Friends and Family: More than three times a week    Frequency of Social Gatherings with Friends and Family: Three times a week    Attends Religious Services: More than 4 times per year    Active Member of Clubs or Organizations: No    Attends Banker Meetings: Never    Marital Status: Patient declined   Intimate Partner Violence: Not At Risk (06/20/2023)   Humiliation, Afraid, Rape, and Kick questionnaire    Fear of Current or Ex-Partner: No    Emotionally Abused: No    Physically Abused: No    Sexually Abused: No    Past Surgical History:  Procedure Laterality Date   AORTIC VALVE REPLACEMENT     BREAST EXCISIONAL BIOPSY Left    benign excision    Family History  Problem Relation Age of Onset   Breast cancer  Neg Hx     Allergies  Allergen Reactions   Aloe Rash   Metformin And Related     Had lactic acidosis   Penicillins Rash   Tape Rash       Latest Ref Rng & Units 06/25/2023    4:18 AM 06/23/2023    5:01 AM 06/21/2023    4:20 AM  CBC  WBC 4.0 - 10.5 K/uL 8.8  9.2  9.2   Hemoglobin 12.0 - 15.0 g/dL 87.0  86.2  86.0   Hematocrit 36.0 - 46.0 % 39.6  41.8  42.6   Platelets 150 - 400 K/uL 279  248  229       CMP     Component Value Date/Time   NA 134 (L) 06/25/2023 0418   K 3.9 06/25/2023 0418   CL 103 06/25/2023 0418   CO2 23 06/25/2023 0418   GLUCOSE 107 (H) 06/25/2023 0418   BUN 13 06/25/2023 0418   CREATININE 0.38 (L) 06/25/2023 0418   CALCIUM 9.1 06/25/2023 0418   PROT 7.1 06/23/2023 0501   ALBUMIN 3.0 (L) 06/23/2023 0501   AST 23 06/23/2023 0501   ALT 18 06/23/2023 0501   ALKPHOS 38 06/23/2023 0501   BILITOT 0.6 06/23/2023 0501   GFRNONAA >60 06/25/2023 0418     No results found.     Assessment & Plan:   1. Atherosclerosis of native artery of both lower extremities with intermittent claudication (Primary) Atherosclerosis of native artery of both lower extremities with intermittent claudication Intermittent claudication with improved blood flow. Right leg ABI 0.63, left leg ABI 0.65. Toe flow 0.39 right, 0.48 left. Blockage in right leg artery and suspected blockage in left leg posterior vessel below knee. Symptoms include leg pain and weakness with walking, no rest pain or non-healing wounds. No immediate intervention needed. Discussed  amputation risk if symptoms worsen, no immediate stroke or heart attack risk. - Encouraged regular walking to promote collateral vessel formation. - Scheduled follow-up for leg evaluation every six months. - Instructed to monitor for changes in symptoms, such as increased pain, non-healing wounds, or changes in foot color and temperature. - VAS US  ABI WITH/WO TBI; Future  2. Bilateral carotid artery stenosis Bilateral carotid artery stenosis Carotid artery stenosis with 1-39% stenosis bilaterally. No stroke or transient ischemic attack symptoms. Current stenosis level does not require intervention. Discussed intervention threshold at 75% stenosis in asymptomatic and 60% in symptomatic patients. Explained no benefit of intervention at lower stenosis levels based on studies. - Scheduled follow-up for carotid artery evaluation annually.   3. Varicose veins of both lower extremities with pain Varicose veins of lower extremities Varicose veins on back of legs with pain and discomfort. No acute complications. Discussed potential for veins to burst, especially with family history. - Continue use of compression stockings to alleviate symptoms. - Monitor for any changes in vein appearance or symptoms.  4. Essential hypertension Continue antihypertensive medications as already ordered, these medications have been reviewed and there are no changes at this time.  5. Type 2 diabetes mellitus without complication, with long-term current use of insulin  (HCC) Continue hypoglycemic medications as already ordered, these medications have been reviewed and there are no changes at this time.  Hgb A1C to be monitored as already arranged by primary serviceContinue hypoglycemic medications as already ordered, these medications have been reviewed and there are no changes at this time.  Hgb A1C to be monitored as already arranged by primary service   Current Outpatient Medications on  File Prior to Visit   Medication Sig Dispense Refill   acetaminophen  (TYLENOL ) 500 MG tablet Take 2 tablets (1,000 mg total) by mouth every 6 (six) hours as needed for mild pain (pain score 1-3), fever or headache (or Fever >/= 101).     alendronate  (FOSAMAX ) 70 MG tablet Take 70 mg by mouth once a week.     aspirin  81 MG chewable tablet Chew 81 mg by mouth daily.     carvedilol  (COREG ) 6.25 MG tablet Take 1 tablet (6.25 mg total) by mouth 2 (two) times daily.     Cholecalciferol  (VITAMIN D3) 5000 units TABS Take 5,000 Units by mouth daily.     Coenzyme Q-10 200 MG CAPS Take 1 capsule by mouth daily.     insulin  aspart (NOVOLOG ) 100 UNIT/ML injection Inject 5 Units into the skin 3 (three) times daily before meals. For cbg >150     insulin  aspart protamine- aspart (NOVOLOG  MIX 70/30) (70-30) 100 UNIT/ML injection Inject 0.3 mLs (30 Units total) into the skin daily with supper.     insulin  aspart protamine- aspart (NOVOLOG  MIX 70/30) (70-30) 100 UNIT/ML injection Inject 0.4 mLs (40 Units total) into the skin daily with breakfast.     lisinopril  (ZESTRIL ) 10 MG tablet Take 1 tablet by mouth daily.     Multiple Vitamin (MULTIVITAMIN WITH MINERALS) TABS tablet Take 1 tablet by mouth daily.     Multiple Vitamins-Minerals (PRESERVISION AREDS 2+MULTI VIT PO) Take 1 capsule by mouth 2 (two) times daily.     Omega-3 Fatty Acids (FISH OIL) 1000 MG CAPS Take 1 capsule by mouth daily.     Magnesium  Oxide, Antacid, 500 MG CAPS Take 1 capsule by mouth daily. (Patient not taking: Reported on 03/13/2024)     ondansetron  (ZOFRAN ) 4 MG tablet Take 1 tablet (4 mg total) by mouth every 6 (six) hours as needed for nausea. (Patient not taking: Reported on 03/13/2024)     oxyCODONE  (OXY IR/ROXICODONE ) 5 MG immediate release tablet Take 5 mg by mouth every 6 (six) hours as needed for severe pain (pain score 7-10). (Patient not taking: Reported on 03/13/2024)     No current facility-administered medications on file prior to visit.    There are  no Patient Instructions on file for this visit. Return in about 6 months (around 09/11/2024) for 6 months ABI with GS/FB.   Landa Mullinax E Mayco Walrond, NP

## 2024-03-19 LAB — VAS US ABI WITH/WO TBI
Left ABI: 0.65
Right ABI: 0.63

## 2024-09-11 ENCOUNTER — Encounter (INDEPENDENT_AMBULATORY_CARE_PROVIDER_SITE_OTHER)

## 2024-09-11 ENCOUNTER — Ambulatory Visit (INDEPENDENT_AMBULATORY_CARE_PROVIDER_SITE_OTHER): Admitting: Nurse Practitioner
